# Patient Record
Sex: Male | Born: 1940
Health system: Southern US, Community
[De-identification: ages and names within clinical notes are randomized; demographics above are authoritative.]

## PROBLEM LIST (undated history)

## (undated) DIAGNOSIS — I951 Orthostatic hypotension: Secondary | ICD-10-CM

## (undated) DIAGNOSIS — H3530 Unspecified macular degeneration: Secondary | ICD-10-CM

## (undated) DIAGNOSIS — N400 Enlarged prostate without lower urinary tract symptoms: Secondary | ICD-10-CM

## (undated) DIAGNOSIS — E781 Pure hyperglyceridemia: Secondary | ICD-10-CM

## (undated) DIAGNOSIS — G20 Parkinson's disease: Secondary | ICD-10-CM

## (undated) DIAGNOSIS — F0390 Unspecified dementia without behavioral disturbance: Secondary | ICD-10-CM

## (undated) DIAGNOSIS — M1990 Unspecified osteoarthritis, unspecified site: Secondary | ICD-10-CM

## (undated) DIAGNOSIS — C801 Malignant (primary) neoplasm, unspecified: Secondary | ICD-10-CM

## (undated) DIAGNOSIS — G20A1 Parkinson's disease without dyskinesia, without mention of fluctuations: Secondary | ICD-10-CM

## (undated) DIAGNOSIS — H409 Unspecified glaucoma: Secondary | ICD-10-CM

## (undated) DIAGNOSIS — R42 Dizziness and giddiness: Secondary | ICD-10-CM

## (undated) DIAGNOSIS — F419 Anxiety disorder, unspecified: Secondary | ICD-10-CM

## (undated) DIAGNOSIS — G903 Multi-system degeneration of the autonomic nervous system: Secondary | ICD-10-CM

## (undated) DIAGNOSIS — K219 Gastro-esophageal reflux disease without esophagitis: Secondary | ICD-10-CM

## (undated) DIAGNOSIS — Z87442 Personal history of urinary calculi: Secondary | ICD-10-CM

## (undated) HISTORY — PX: TONSILLECTOMY: SUR1361

## (undated) HISTORY — PX: HAND SURGERY: SHX662

## (undated) HISTORY — PX: EYE SURGERY: SHX253

---
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
## 1997-01-29 HISTORY — PX: SHOULDER ARTHROSCOPY: SHX128

## 2005-01-29 DIAGNOSIS — C801 Malignant (primary) neoplasm, unspecified: Secondary | ICD-10-CM

## 2005-01-29 HISTORY — DX: Malignant (primary) neoplasm, unspecified: C80.1

## 2005-06-18 ENCOUNTER — Ambulatory Visit: Payer: Self-pay | Admitting: Urology

## 2005-07-02 ENCOUNTER — Ambulatory Visit: Payer: Self-pay | Admitting: Radiation Oncology

## 2005-08-06 ENCOUNTER — Ambulatory Visit: Payer: Self-pay | Admitting: Unknown Physician Specialty

## 2008-04-22 ENCOUNTER — Ambulatory Visit: Payer: Self-pay | Admitting: Unknown Physician Specialty

## 2009-01-25 ENCOUNTER — Ambulatory Visit: Payer: Self-pay | Admitting: Ophthalmology

## 2013-07-09 ENCOUNTER — Ambulatory Visit: Payer: Self-pay | Admitting: Neurology

## 2013-12-10 ENCOUNTER — Ambulatory Visit: Payer: Self-pay

## 2013-12-30 ENCOUNTER — Ambulatory Visit: Payer: Self-pay

## 2015-02-02 DIAGNOSIS — H353212 Exudative age-related macular degeneration, right eye, with inactive choroidal neovascularization: Secondary | ICD-10-CM | POA: Diagnosis not present

## 2015-02-18 DIAGNOSIS — G629 Polyneuropathy, unspecified: Secondary | ICD-10-CM | POA: Diagnosis not present

## 2015-02-18 DIAGNOSIS — G20 Parkinson's disease: Secondary | ICD-10-CM | POA: Diagnosis not present

## 2015-02-21 DIAGNOSIS — H353222 Exudative age-related macular degeneration, left eye, with inactive choroidal neovascularization: Secondary | ICD-10-CM | POA: Diagnosis not present

## 2015-04-18 DIAGNOSIS — H401132 Primary open-angle glaucoma, bilateral, moderate stage: Secondary | ICD-10-CM | POA: Diagnosis not present

## 2015-04-25 DIAGNOSIS — H353212 Exudative age-related macular degeneration, right eye, with inactive choroidal neovascularization: Secondary | ICD-10-CM | POA: Diagnosis not present

## 2015-04-27 DIAGNOSIS — H353222 Exudative age-related macular degeneration, left eye, with inactive choroidal neovascularization: Secondary | ICD-10-CM | POA: Diagnosis not present

## 2015-04-29 DIAGNOSIS — H401131 Primary open-angle glaucoma, bilateral, mild stage: Secondary | ICD-10-CM | POA: Diagnosis not present

## 2015-06-10 DIAGNOSIS — Z79899 Other long term (current) drug therapy: Secondary | ICD-10-CM | POA: Diagnosis not present

## 2015-06-10 DIAGNOSIS — E782 Mixed hyperlipidemia: Secondary | ICD-10-CM | POA: Diagnosis not present

## 2015-06-16 DIAGNOSIS — G20 Parkinson's disease: Secondary | ICD-10-CM | POA: Diagnosis not present

## 2015-06-16 DIAGNOSIS — Z0000 Encounter for general adult medical examination without abnormal findings: Secondary | ICD-10-CM | POA: Diagnosis not present

## 2015-07-01 DIAGNOSIS — H353222 Exudative age-related macular degeneration, left eye, with inactive choroidal neovascularization: Secondary | ICD-10-CM | POA: Diagnosis not present

## 2015-07-11 DIAGNOSIS — H353212 Exudative age-related macular degeneration, right eye, with inactive choroidal neovascularization: Secondary | ICD-10-CM | POA: Diagnosis not present

## 2015-07-15 DIAGNOSIS — Z85828 Personal history of other malignant neoplasm of skin: Secondary | ICD-10-CM | POA: Diagnosis not present

## 2015-07-15 DIAGNOSIS — X32XXXA Exposure to sunlight, initial encounter: Secondary | ICD-10-CM | POA: Diagnosis not present

## 2015-07-15 DIAGNOSIS — L218 Other seborrheic dermatitis: Secondary | ICD-10-CM | POA: Diagnosis not present

## 2015-07-15 DIAGNOSIS — D225 Melanocytic nevi of trunk: Secondary | ICD-10-CM | POA: Diagnosis not present

## 2015-07-15 DIAGNOSIS — L570 Actinic keratosis: Secondary | ICD-10-CM | POA: Diagnosis not present

## 2015-08-19 DIAGNOSIS — G629 Polyneuropathy, unspecified: Secondary | ICD-10-CM | POA: Diagnosis not present

## 2015-08-19 DIAGNOSIS — G20 Parkinson's disease: Secondary | ICD-10-CM | POA: Diagnosis not present

## 2015-09-07 DIAGNOSIS — R351 Nocturia: Secondary | ICD-10-CM | POA: Diagnosis not present

## 2015-09-07 DIAGNOSIS — N401 Enlarged prostate with lower urinary tract symptoms: Secondary | ICD-10-CM | POA: Diagnosis not present

## 2015-09-07 DIAGNOSIS — D400 Neoplasm of uncertain behavior of prostate: Secondary | ICD-10-CM | POA: Diagnosis not present

## 2015-09-07 DIAGNOSIS — N5201 Erectile dysfunction due to arterial insufficiency: Secondary | ICD-10-CM | POA: Diagnosis not present

## 2015-09-07 DIAGNOSIS — C61 Malignant neoplasm of prostate: Secondary | ICD-10-CM | POA: Diagnosis not present

## 2015-09-12 DIAGNOSIS — H353222 Exudative age-related macular degeneration, left eye, with inactive choroidal neovascularization: Secondary | ICD-10-CM | POA: Diagnosis not present

## 2015-10-24 DIAGNOSIS — H401130 Primary open-angle glaucoma, bilateral, stage unspecified: Secondary | ICD-10-CM | POA: Diagnosis not present

## 2015-10-28 DIAGNOSIS — H353212 Exudative age-related macular degeneration, right eye, with inactive choroidal neovascularization: Secondary | ICD-10-CM | POA: Diagnosis not present

## 2015-10-28 DIAGNOSIS — H353222 Exudative age-related macular degeneration, left eye, with inactive choroidal neovascularization: Secondary | ICD-10-CM | POA: Diagnosis not present

## 2015-11-25 DIAGNOSIS — H353221 Exudative age-related macular degeneration, left eye, with active choroidal neovascularization: Secondary | ICD-10-CM | POA: Diagnosis not present

## 2016-01-13 DIAGNOSIS — H353212 Exudative age-related macular degeneration, right eye, with inactive choroidal neovascularization: Secondary | ICD-10-CM | POA: Diagnosis not present

## 2016-01-30 HISTORY — PX: OTHER SURGICAL HISTORY: SHX169

## 2016-02-03 DIAGNOSIS — H353222 Exudative age-related macular degeneration, left eye, with inactive choroidal neovascularization: Secondary | ICD-10-CM | POA: Diagnosis not present

## 2016-02-20 DIAGNOSIS — R2689 Other abnormalities of gait and mobility: Secondary | ICD-10-CM | POA: Diagnosis not present

## 2016-02-20 DIAGNOSIS — G20 Parkinson's disease: Secondary | ICD-10-CM | POA: Diagnosis not present

## 2016-02-20 DIAGNOSIS — R4189 Other symptoms and signs involving cognitive functions and awareness: Secondary | ICD-10-CM | POA: Diagnosis not present

## 2016-03-30 DIAGNOSIS — H353222 Exudative age-related macular degeneration, left eye, with inactive choroidal neovascularization: Secondary | ICD-10-CM | POA: Diagnosis not present

## 2016-04-06 DIAGNOSIS — H353211 Exudative age-related macular degeneration, right eye, with active choroidal neovascularization: Secondary | ICD-10-CM | POA: Diagnosis not present

## 2016-05-09 DIAGNOSIS — H401130 Primary open-angle glaucoma, bilateral, stage unspecified: Secondary | ICD-10-CM | POA: Diagnosis not present

## 2016-05-17 DIAGNOSIS — H401130 Primary open-angle glaucoma, bilateral, stage unspecified: Secondary | ICD-10-CM | POA: Diagnosis not present

## 2016-05-21 DIAGNOSIS — G4752 REM sleep behavior disorder: Secondary | ICD-10-CM | POA: Diagnosis not present

## 2016-05-21 DIAGNOSIS — G903 Multi-system degeneration of the autonomic nervous system: Secondary | ICD-10-CM | POA: Diagnosis not present

## 2016-05-21 DIAGNOSIS — F0280 Dementia in other diseases classified elsewhere without behavioral disturbance: Secondary | ICD-10-CM | POA: Diagnosis not present

## 2016-05-21 DIAGNOSIS — G20 Parkinson's disease: Secondary | ICD-10-CM | POA: Diagnosis not present

## 2016-06-08 DIAGNOSIS — H353222 Exudative age-related macular degeneration, left eye, with inactive choroidal neovascularization: Secondary | ICD-10-CM | POA: Diagnosis not present

## 2016-06-11 DIAGNOSIS — Z0000 Encounter for general adult medical examination without abnormal findings: Secondary | ICD-10-CM | POA: Diagnosis not present

## 2016-06-15 DIAGNOSIS — H353212 Exudative age-related macular degeneration, right eye, with inactive choroidal neovascularization: Secondary | ICD-10-CM | POA: Diagnosis not present

## 2016-06-18 DIAGNOSIS — G20 Parkinson's disease: Secondary | ICD-10-CM | POA: Diagnosis not present

## 2016-06-18 DIAGNOSIS — Z0000 Encounter for general adult medical examination without abnormal findings: Secondary | ICD-10-CM | POA: Diagnosis not present

## 2016-06-18 DIAGNOSIS — E782 Mixed hyperlipidemia: Secondary | ICD-10-CM | POA: Diagnosis not present

## 2016-06-18 DIAGNOSIS — Z125 Encounter for screening for malignant neoplasm of prostate: Secondary | ICD-10-CM | POA: Diagnosis not present

## 2016-06-18 DIAGNOSIS — Z79899 Other long term (current) drug therapy: Secondary | ICD-10-CM | POA: Diagnosis not present

## 2016-07-18 DIAGNOSIS — X32XXXA Exposure to sunlight, initial encounter: Secondary | ICD-10-CM | POA: Diagnosis not present

## 2016-07-18 DIAGNOSIS — D485 Neoplasm of uncertain behavior of skin: Secondary | ICD-10-CM | POA: Diagnosis not present

## 2016-07-18 DIAGNOSIS — D2261 Melanocytic nevi of right upper limb, including shoulder: Secondary | ICD-10-CM | POA: Diagnosis not present

## 2016-07-18 DIAGNOSIS — D2272 Melanocytic nevi of left lower limb, including hip: Secondary | ICD-10-CM | POA: Diagnosis not present

## 2016-07-18 DIAGNOSIS — D225 Melanocytic nevi of trunk: Secondary | ICD-10-CM | POA: Diagnosis not present

## 2016-07-18 DIAGNOSIS — C44321 Squamous cell carcinoma of skin of nose: Secondary | ICD-10-CM | POA: Diagnosis not present

## 2016-07-18 DIAGNOSIS — Z85828 Personal history of other malignant neoplasm of skin: Secondary | ICD-10-CM | POA: Diagnosis not present

## 2016-07-18 DIAGNOSIS — L570 Actinic keratosis: Secondary | ICD-10-CM | POA: Diagnosis not present

## 2016-08-08 DIAGNOSIS — C44321 Squamous cell carcinoma of skin of nose: Secondary | ICD-10-CM | POA: Diagnosis not present

## 2016-08-08 DIAGNOSIS — Z85828 Personal history of other malignant neoplasm of skin: Secondary | ICD-10-CM | POA: Diagnosis not present

## 2016-08-22 DIAGNOSIS — G4752 REM sleep behavior disorder: Secondary | ICD-10-CM | POA: Diagnosis not present

## 2016-08-22 DIAGNOSIS — G903 Multi-system degeneration of the autonomic nervous system: Secondary | ICD-10-CM | POA: Diagnosis not present

## 2016-08-22 DIAGNOSIS — M720 Palmar fascial fibromatosis [Dupuytren]: Secondary | ICD-10-CM | POA: Diagnosis not present

## 2016-08-22 DIAGNOSIS — F0280 Dementia in other diseases classified elsewhere without behavioral disturbance: Secondary | ICD-10-CM | POA: Diagnosis not present

## 2016-08-22 DIAGNOSIS — G20 Parkinson's disease: Secondary | ICD-10-CM | POA: Diagnosis not present

## 2016-08-22 DIAGNOSIS — R4189 Other symptoms and signs involving cognitive functions and awareness: Secondary | ICD-10-CM | POA: Diagnosis not present

## 2016-08-24 DIAGNOSIS — H353212 Exudative age-related macular degeneration, right eye, with inactive choroidal neovascularization: Secondary | ICD-10-CM | POA: Diagnosis not present

## 2016-08-24 DIAGNOSIS — H353222 Exudative age-related macular degeneration, left eye, with inactive choroidal neovascularization: Secondary | ICD-10-CM | POA: Diagnosis not present

## 2016-08-30 DIAGNOSIS — M720 Palmar fascial fibromatosis [Dupuytren]: Secondary | ICD-10-CM | POA: Diagnosis not present

## 2016-08-30 DIAGNOSIS — M67449 Ganglion, unspecified hand: Secondary | ICD-10-CM | POA: Diagnosis not present

## 2016-09-10 DIAGNOSIS — N5201 Erectile dysfunction due to arterial insufficiency: Secondary | ICD-10-CM | POA: Diagnosis not present

## 2016-09-10 DIAGNOSIS — R351 Nocturia: Secondary | ICD-10-CM | POA: Diagnosis not present

## 2016-09-10 DIAGNOSIS — C61 Malignant neoplasm of prostate: Secondary | ICD-10-CM | POA: Diagnosis not present

## 2016-09-10 DIAGNOSIS — N486 Induration penis plastica: Secondary | ICD-10-CM | POA: Diagnosis not present

## 2016-09-10 DIAGNOSIS — N401 Enlarged prostate with lower urinary tract symptoms: Secondary | ICD-10-CM | POA: Diagnosis not present

## 2016-09-10 DIAGNOSIS — D400 Neoplasm of uncertain behavior of prostate: Secondary | ICD-10-CM | POA: Diagnosis not present

## 2016-10-29 DIAGNOSIS — H353212 Exudative age-related macular degeneration, right eye, with inactive choroidal neovascularization: Secondary | ICD-10-CM | POA: Diagnosis not present

## 2016-10-29 DIAGNOSIS — H353222 Exudative age-related macular degeneration, left eye, with inactive choroidal neovascularization: Secondary | ICD-10-CM | POA: Diagnosis not present

## 2016-11-16 DIAGNOSIS — H401130 Primary open-angle glaucoma, bilateral, stage unspecified: Secondary | ICD-10-CM | POA: Diagnosis not present

## 2016-11-26 DIAGNOSIS — G903 Multi-system degeneration of the autonomic nervous system: Secondary | ICD-10-CM | POA: Diagnosis not present

## 2016-11-26 DIAGNOSIS — G4752 REM sleep behavior disorder: Secondary | ICD-10-CM | POA: Diagnosis not present

## 2016-11-26 DIAGNOSIS — G20 Parkinson's disease: Secondary | ICD-10-CM | POA: Diagnosis not present

## 2016-11-26 DIAGNOSIS — M720 Palmar fascial fibromatosis [Dupuytren]: Secondary | ICD-10-CM | POA: Diagnosis not present

## 2016-11-26 DIAGNOSIS — F0280 Dementia in other diseases classified elsewhere without behavioral disturbance: Secondary | ICD-10-CM | POA: Diagnosis not present

## 2017-01-14 DIAGNOSIS — H353212 Exudative age-related macular degeneration, right eye, with inactive choroidal neovascularization: Secondary | ICD-10-CM | POA: Diagnosis not present

## 2017-01-14 DIAGNOSIS — H353222 Exudative age-related macular degeneration, left eye, with inactive choroidal neovascularization: Secondary | ICD-10-CM | POA: Diagnosis not present

## 2017-01-28 DIAGNOSIS — Z08 Encounter for follow-up examination after completed treatment for malignant neoplasm: Secondary | ICD-10-CM | POA: Diagnosis not present

## 2017-01-28 DIAGNOSIS — Z85828 Personal history of other malignant neoplasm of skin: Secondary | ICD-10-CM | POA: Diagnosis not present

## 2017-01-28 DIAGNOSIS — L570 Actinic keratosis: Secondary | ICD-10-CM | POA: Diagnosis not present

## 2017-01-28 DIAGNOSIS — X32XXXA Exposure to sunlight, initial encounter: Secondary | ICD-10-CM | POA: Diagnosis not present

## 2017-01-28 DIAGNOSIS — D225 Melanocytic nevi of trunk: Secondary | ICD-10-CM | POA: Diagnosis not present

## 2017-02-13 DIAGNOSIS — M7752 Other enthesopathy of left foot: Secondary | ICD-10-CM | POA: Diagnosis not present

## 2017-02-13 DIAGNOSIS — Q828 Other specified congenital malformations of skin: Secondary | ICD-10-CM | POA: Diagnosis not present

## 2017-02-26 DIAGNOSIS — G903 Multi-system degeneration of the autonomic nervous system: Secondary | ICD-10-CM | POA: Diagnosis not present

## 2017-02-26 DIAGNOSIS — M720 Palmar fascial fibromatosis [Dupuytren]: Secondary | ICD-10-CM | POA: Diagnosis not present

## 2017-02-26 DIAGNOSIS — F0280 Dementia in other diseases classified elsewhere without behavioral disturbance: Secondary | ICD-10-CM | POA: Diagnosis not present

## 2017-02-26 DIAGNOSIS — G4752 REM sleep behavior disorder: Secondary | ICD-10-CM | POA: Diagnosis not present

## 2017-02-26 DIAGNOSIS — G20 Parkinson's disease: Secondary | ICD-10-CM | POA: Diagnosis not present

## 2017-02-26 DIAGNOSIS — R4189 Other symptoms and signs involving cognitive functions and awareness: Secondary | ICD-10-CM | POA: Diagnosis not present

## 2017-03-25 DIAGNOSIS — R109 Unspecified abdominal pain: Secondary | ICD-10-CM | POA: Diagnosis not present

## 2017-03-25 DIAGNOSIS — H353222 Exudative age-related macular degeneration, left eye, with inactive choroidal neovascularization: Secondary | ICD-10-CM | POA: Diagnosis not present

## 2017-03-25 DIAGNOSIS — H353212 Exudative age-related macular degeneration, right eye, with inactive choroidal neovascularization: Secondary | ICD-10-CM | POA: Diagnosis not present

## 2017-03-25 DIAGNOSIS — H532 Diplopia: Secondary | ICD-10-CM | POA: Diagnosis not present

## 2017-04-03 DIAGNOSIS — H0011 Chalazion right upper eyelid: Secondary | ICD-10-CM | POA: Diagnosis not present

## 2017-04-29 DIAGNOSIS — R42 Dizziness and giddiness: Secondary | ICD-10-CM

## 2017-04-29 HISTORY — DX: Dizziness and giddiness: R42

## 2017-05-07 DIAGNOSIS — H532 Diplopia: Secondary | ICD-10-CM | POA: Diagnosis not present

## 2017-05-07 DIAGNOSIS — H409 Unspecified glaucoma: Secondary | ICD-10-CM | POA: Diagnosis not present

## 2017-05-07 DIAGNOSIS — H3530 Unspecified macular degeneration: Secondary | ICD-10-CM | POA: Diagnosis not present

## 2017-05-14 DIAGNOSIS — K4090 Unilateral inguinal hernia, without obstruction or gangrene, not specified as recurrent: Secondary | ICD-10-CM | POA: Diagnosis not present

## 2017-05-16 ENCOUNTER — Ambulatory Visit: Payer: Self-pay | Admitting: General Surgery

## 2017-05-16 DIAGNOSIS — K4090 Unilateral inguinal hernia, without obstruction or gangrene, not specified as recurrent: Secondary | ICD-10-CM | POA: Diagnosis not present

## 2017-05-16 NOTE — H&P (View-Only) (Signed)
PATIENT PROFILE: Steven Buckley is a 77 y.o. male who presents to the Clinic for consultation at the request of Dr. Sabra Buckley for evaluation of right inguinal hernia.  PCP:  Steven Pax, MD  HISTORY OF PRESENT ILLNESS: Mr. Steven Buckley reports feeling a lump on the right groin for several months but has was having pain. In the last 3 weeks has has been having more discomfort, pain and the lump has got bigger. Pain is increased with doing heavy lifting and walking. Pain is relieved with resting and when the hernia is reduced. Pain does not radiates to other part of the body. Patient refers pain has affected the distance he is able to walk due to the pain and the discomfort. Denies episodes of abdominal distention, nausea or vomiting. Patient has history of left inguinal hernia repair when he was a child. No other abdominal surgeries.   PROBLEM LIST: Problem List  Date Reviewed: 02/26/2017         Noted   Non-recurrent unilateral inguinal hernia without obstruction or gangrene 05/16/2017   Dupuytren's contracture 08/30/2016   Mucous cyst of finger 08/30/2016   Medicare annual wellness visit, initial 06/18/2016   Overview    5/18      Dementia 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 (CMS-HCC) 05/21/2016   Neurogenic orthostatic hypotension (CMS-HCC) 05/21/2016   RBD (REM behavioral disorder) 05/21/2016   Parkinson's disease (CMS-HCC) 03/03/2016   Silent thyroiditis 12/28/2013   Mixed hypercholesterolemia and hypertriglyceridemia (Chronic) 04/18/2011   Osteoarthritis (Chronic) 04/18/2011   H/O prostate cancer 04/18/2011   Macular degeneration (Chronic) 04/18/2011      GENERAL REVIEW OF SYSTEMS:   General ROS: negative for - chills, fatigue, fever, weight gain or weight loss Allergy and Immunology ROS: negative for - hives  Hematological and Lymphatic ROS: negative for - bleeding problems or bruising, negative for palpable nodes Endocrine ROS: negative for - heat or cold  intolerance, hair changes Respiratory ROS: negative for - cough, shortness of breath or wheezing Cardiovascular ROS: no chest pain or palpitations GI ROS: negative for nausea, vomiting, abdominal pain, diarrhea, constipation Musculoskeletal ROS: negative for - joint swelling or muscle pain Neurological ROS: negative for - confusion, syncope Dermatological ROS: negative for pruritus and rash Psychiatric: negative for anxiety, depression, difficulty sleeping and memory loss  MEDICATIONS: Current Outpatient Medications  Medication Sig Dispense Refill  . bevacizumab (AVASTIN) 25 mg/mL injection Inject 1 mg into the vein. Injection in 1 eye then injection in the other 8 the next week and patient gets injections every 6-8 weeks    . calcium citrate-vitamin D3 (CITRACAL+D) 315-200 mg-unit tablet Take 1 tablet by mouth once daily.    . carbidopa-levodopa (SINEMET) 25-100 mg tablet TAKE TWO TABLETS BY MOUTH 4 TIMES DAILY 720 tablet 3  . CHOLECALCIFEROL, VITAMIN D3, (VITAMIN D3 ORAL) Take 2,000 Units by mouth once daily.      Marland Kitchen co-enzyme Q-10, ubiquinone, 30 mg capsule Take 30 mg by mouth once daily.     . CYANOCOBALAMIN, VITAMIN B-12, (VITAMIN B-12 ORAL) Place 1 tablet under the tongue once daily.      . flaxseed oil Oil Take by mouth once daily. Ground up seeds, 1/4 cup daily    . fludrocortisone (FLORINEF) 0.1 mg tablet Take 1 tablet (0.1 mg total) by mouth 2 (two) times daily. Take in the morning and afternoon. 180 tablet 3  . ketoconazole (NIZORAL) 2 % shampoo once daily as needed.      . melatonin 10 mg Tab  Take by mouth    . multivitamin tablet Take 1 tablet by mouth once daily.    Marland Kitchen NIACIN, INOSITOL NIACINATE, (NIACIN FLUSH FREE ORAL) Take by mouth once daily.     . nutritional supplements Liqd Take 6 capsules by mouth once daily. Juice Plus    . polyethylene glycol (MIRALAX) packet MIX AND TAKE ONE PACKET (17 GRAMS) BY MOUTH ONCE DAILY. MIX IN 4 TO 8 OUNCES OF FLUID PRIOR TO TAKING 90  packet 0  . RAPAFLO 8 mg capsule Take 1 capsule by mouth once a week.      . tamsulosin (FLOMAX) 0.4 mg capsule Take 0.4 mg by mouth twice a week. Take 30 minutes after same meal each day.    . timolol maleate (TIMOPTIC) 0.5 % ophthalmic solution Place 1 drop into both eyes daily.    Marland Kitchen VIT A/VIT C/VIT E/ZINC/COPPER (PRESERVISION AREDS ORAL) Take 1 tablet by mouth 2 (two) times daily.      . carbidopa-levodopa (SINEMET CR) 50-200 mg CR tablet Take 1 tablet by mouth nightly 90 tablet 3   No current facility-administered medications for this visit.     ALLERGIES: Patient has no known allergies.  PAST MEDICAL HISTORY: Past Medical History:  Diagnosis Date  . BPH (benign prostatic hypertrophy)    takes Saw palmetto  . Dementia 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 (CMS-HCC) 05/21/2016  . GERD (gastroesophageal reflux disease)   . Glaucoma (increased eye pressure)    Chronic Angle-Closure - followed at [facility]  . Hyperlipidemia, unspecified   . Macular degeneration    followed at [facility]  . Osteoarthritis   . Prostate cancer (CMS-HCC)   . Squamous cell carcinoma    nose    PAST SURGICAL HISTORY: Past Surgical History:  Procedure Laterality Date  . COLONOSCOPY  06/06/00; 11/12/12; 04/22/08; 04/21/13   Adenomatous polyp  . Hand surgery  1994  . HERNIA REPAIR    . INSERTION PROSTATE RADIATION SEED    . TONSILLECTOMY       FAMILY HISTORY: Family History  Problem Relation Age of Onset  . Cirrhosis Mother   . Liver disease Mother   . COPD Father   . Myocardial Infarction (Heart attack) Father   . Brain cancer Sister   . Lung cancer Sister   . Diabetes Brother      SOCIAL HISTORY: Social History   Socioeconomic History  . Marital status: Married    Spouse name: Not on file  . Number of children: Not on file  . Years of education: Not on file  . Highest education level: Not on file  Occupational History  . Not on file  Social Needs  . Financial resource  strain: Not on file  . Food insecurity:    Worry: Not on file    Inability: Not on file  . Transportation needs:    Medical: Not on file    Non-medical: Not on file  Tobacco Use  . Smoking status: Former Smoker    Last attempt to quit: 04/18/1967    Years since quitting: 50.1  . Smokeless tobacco: Former Network engineer and Sexual Activity  . Alcohol use: Yes    Types: 1 Glasses of wine per week    Comment: Drink wine about 1 time a month  . Drug use: No  . Sexual activity: Not Currently    Partners: Female    Comment: NA  Other Topics Concern  . Not on file  Social History Narrative  [place]  Pets: Dog. Cats   No active animals on farm.    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     PHYSICAL EXAM: Vitals:   05/16/17 1001  BP: 123/84  Pulse: 71  Temp: (!) 35.9 C (96.6 F)   Body mass index is 28.03 kg/m. Weight: 91.2 kg (201 lb)   GENERAL: Alert, active, oriented x3  HEENT: Pupils equal reactive to light. Extraocular movements are intact. Sclera clear. Palpebral conjunctiva normal red color.Pharynx clear.  NECK: Supple with no palpable mass and no adenopathy.  LUNGS: Sound clear with no rales rhonchi or wheezes.  HEART: Regular rhythm S1 and S2 without murmur.  ABDOMEN: Soft and depressible, nontender with no palpable mass, no hepatomegaly. Right inguinal hernia with mild tender upon reduction. Reducible with minor difficulty.   EXTREMITIES: Well-developed well-nourished symmetrical with no dependent edema.  NEUROLOGICAL: Awake alert oriented, facial expression symmetrical, moving all extremities.  REVIEW OF DATA: I have reviewed the following data today: Office Visit on 03/25/2017  Component Date Value  . WBC (White Blood Cell Co* 03/25/2017 6.1   . RBC (Red Blood Cell Coun* 03/25/2017 4.95   . Hemoglobin 03/25/2017 15.4   . Hematocrit 03/25/2017 47.1   . MCV (Mean  Corpuscular Vo* 03/25/2017 95.2   . MCH (Mean Corpuscular He* 03/25/2017 31.1   . MCHC (Mean Corpuscular H* 03/25/2017 32.7   . Platelet Count 03/25/2017 170   . RDW-CV (Red Cell Distrib* 03/25/2017 13.3   . MPV (Mean Platelet Volum* 03/25/2017 9.9   . Neutrophils 03/25/2017 3.74   . Lymphocytes 03/25/2017 1.76   . Monocytes 03/25/2017 0.51   . Eosinophils 03/25/2017 0.06   . Basophils 03/25/2017 0.03   . Neutrophil % 03/25/2017 61.1   . Lymphocyte % 03/25/2017 28.8   . Monocyte % 03/25/2017 8.3   . Eosinophil % 03/25/2017 1.0   . Basophil% 03/25/2017 0.5   . Immature Granulocyte % 03/25/2017 0.3   . Immature Granulocyte Cou* 03/25/2017 0.02   . Glucose 03/25/2017 102   . Sodium 03/25/2017 141   . Potassium 03/25/2017 3.7   . Chloride 03/25/2017 105   . Carbon Dioxide (CO2) 03/25/2017 33.2*  . Urea Nitrogen (BUN) 03/25/2017 11   . Creatinine 03/25/2017 0.9   . Glomerular Filtration Ra* 03/25/2017 82   . Calcium 03/25/2017 9.0   . AST  03/25/2017 19   . ALT  03/25/2017 6   . Alk Phos (alkaline Phosp* 03/25/2017 84   . Albumin 03/25/2017 3.8   . Bilirubin, Total 03/25/2017 0.8   . Protein, Total 03/25/2017 6.5   . A/G Ratio 03/25/2017 1.4   . Lipase 03/25/2017 5*  . Color 03/25/2017 Yellow   . Clarity 03/25/2017 Clear   . Specific Gravity 03/25/2017 1.010   . pH, Urine 03/25/2017 6.5   . Protein, Urinalysis 03/25/2017 Negative   . Glucose, Urinalysis 03/25/2017 Negative   . Ketones, Urinalysis 03/25/2017 Negative   . Blood, Urinalysis 03/25/2017 Negative   . Nitrite, Urinalysis 03/25/2017 Negative   . Leukocyte Esterase, Urin* 03/25/2017 Negative   . White Blood Cells, Urina* 03/25/2017 None Seen   . Red Blood Cells, Urinaly* 03/25/2017 None Seen   . Bacteria, Urinalysis 03/25/2017 Rare*  . Squamous Epithelial Cell* 03/25/2017 None Seen      ASSESSMENT: Mr. Saindon is a 77 y.o. male presenting for consultation forright inguinal hernia.    The patient presents  with a  symptomatic, reducible inguinal hernia. Patient was oriented about the diagnosis of inguinal hernia and its implication. The patient was oriented about the treatment alternatives (observation vs surgical repair). Due to patient symptoms, repair is recommended. Patient oriented about the surgical procedure, the use of mesh and its risk of complications such as: infection, bleeding, injury to vas deference, vasculature and testicle, injury to bowel or bladder, and chronic pain.   PLAN: 1. Right inguinal hernia repair with mesh 16109 2. CBC, CMP done (03/25/17) 3. Internal Medicine Clearance by Dr. Sabra Buckley - done 4. Avoid aspirin 5 days before suregery  Patient and his wife verbalized understanding, all questions were answered, and were agreeable with the plan outlined above.   Herbert Pun, MD  Electronically signed by Herbert Pun, MD

## 2017-05-16 NOTE — H&P (Signed)
PATIENT PROFILE: Steven Buckley is a 77 y.o. male who presents to the Clinic for consultation at the request of Dr. Miller for evaluation of right inguinal hernia.  PCP:  Miller, Mark Frederic, MD  HISTORY OF PRESENT ILLNESS: [name] reports feeling a lump on the right groin for several months but has was having pain. In the last 3 weeks has has been having more discomfort, pain and the lump has got bigger. Pain is increased with doing heavy lifting and walking. Pain is relieved with resting and when the hernia is reduced. Pain does not radiates to other part of the body. Patient refers pain has affected the distance he is able to walk due to the pain and the discomfort. Denies episodes of abdominal distention, nausea or vomiting. Patient has history of left inguinal hernia repair when he was a child. No other abdominal surgeries.   PROBLEM LIST: Problem List  Date Reviewed: 02/26/2017         Noted   Non-recurrent unilateral inguinal hernia without obstruction or gangrene 05/16/2017   Dupuytren's contracture 08/30/2016   Mucous cyst of finger 08/30/2016   Medicare annual wellness visit, initial 06/18/2016   Overview    5/18      Dementia 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 (CMS-HCC) 05/21/2016   Neurogenic orthostatic hypotension (CMS-HCC) 05/21/2016   RBD (REM behavioral disorder) 05/21/2016   Parkinson's disease (CMS-HCC) 03/03/2016   Silent thyroiditis 12/28/2013   Mixed hypercholesterolemia and hypertriglyceridemia (Chronic) 04/18/2011   Osteoarthritis (Chronic) 04/18/2011   H/O prostate cancer 04/18/2011   Macular degeneration (Chronic) 04/18/2011      GENERAL REVIEW OF SYSTEMS:   General ROS: negative for - chills, fatigue, fever, weight gain or weight loss Allergy and Immunology ROS: negative for - hives  Hematological and Lymphatic ROS: negative for - bleeding problems or bruising, negative for palpable nodes Endocrine ROS: negative for - heat or cold  intolerance, hair changes Respiratory ROS: negative for - cough, shortness of breath or wheezing Cardiovascular ROS: no chest pain or palpitations GI ROS: negative for nausea, vomiting, abdominal pain, diarrhea, constipation Musculoskeletal ROS: negative for - joint swelling or muscle pain Neurological ROS: negative for - confusion, syncope Dermatological ROS: negative for pruritus and rash Psychiatric: negative for anxiety, depression, difficulty sleeping and memory loss  MEDICATIONS: Current Outpatient Medications  Medication Sig Dispense Refill  . bevacizumab (AVASTIN) 25 mg/mL injection Inject 1 mg into the vein. Injection in 1 eye then injection in the other 8 the next week and patient gets injections every 6-8 weeks    . calcium citrate-vitamin D3 (CITRACAL+D) 315-200 mg-unit tablet Take 1 tablet by mouth once daily.    . carbidopa-levodopa (SINEMET) 25-100 mg tablet TAKE TWO TABLETS BY MOUTH 4 TIMES DAILY 720 tablet 3  . CHOLECALCIFEROL, VITAMIN D3, (VITAMIN D3 ORAL) Take 2,000 Units by mouth once daily.      . co-enzyme Q-10, ubiquinone, 30 mg capsule Take 30 mg by mouth once daily.     . CYANOCOBALAMIN, VITAMIN B-12, (VITAMIN B-12 ORAL) Place 1 tablet under the tongue once daily.      . flaxseed oil Oil Take by mouth once daily. Ground up seeds, 1/4 cup daily    . fludrocortisone (FLORINEF) 0.1 mg tablet Take 1 tablet (0.1 mg total) by mouth 2 (two) times daily. Take in the morning and afternoon. 180 tablet 3  . ketoconazole (NIZORAL) 2 % shampoo once daily as needed.      . melatonin 10 mg Tab   Take by mouth    . multivitamin tablet Take 1 tablet by mouth once daily.    . NIACIN, INOSITOL NIACINATE, (NIACIN FLUSH FREE ORAL) Take by mouth once daily.     . nutritional supplements Liqd Take 6 capsules by mouth once daily. Juice Plus    . polyethylene glycol (MIRALAX) packet MIX AND TAKE ONE PACKET (17 GRAMS) BY MOUTH ONCE DAILY. MIX IN 4 TO 8 OUNCES OF FLUID PRIOR TO TAKING 90  packet 0  . RAPAFLO 8 mg capsule Take 1 capsule by mouth once a week.      . tamsulosin (FLOMAX) 0.4 mg capsule Take 0.4 mg by mouth twice a week. Take 30 minutes after same meal each day.    . timolol maleate (TIMOPTIC) 0.5 % ophthalmic solution Place 1 drop into both eyes daily.    . VIT A/VIT C/VIT E/ZINC/COPPER (PRESERVISION AREDS ORAL) Take 1 tablet by mouth 2 (two) times daily.      . carbidopa-levodopa (SINEMET CR) 50-200 mg CR tablet Take 1 tablet by mouth nightly 90 tablet 3   No current facility-administered medications for this visit.     ALLERGIES: Patient has no known allergies.  PAST MEDICAL HISTORY: Past Medical History:  Diagnosis Date  . BPH (benign prostatic hypertrophy)    takes Saw palmetto  . Dementia 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 (CMS-HCC) 05/21/2016  . GERD (gastroesophageal reflux disease)   . Glaucoma (increased eye pressure)    Chronic Angle-Closure - followed at [facility]  . Hyperlipidemia, unspecified   . Macular degeneration    followed at [facility]  . Osteoarthritis   . Prostate cancer (CMS-HCC)   . Squamous cell carcinoma    nose    PAST SURGICAL HISTORY: Past Surgical History:  Procedure Laterality Date  . COLONOSCOPY  06/06/00; 11/12/12; 04/22/08; 04/21/13   Adenomatous polyp  . Hand surgery  1994  . HERNIA REPAIR    . INSERTION PROSTATE RADIATION SEED    . TONSILLECTOMY       FAMILY HISTORY: Family History  Problem Relation Age of Onset  . Cirrhosis Mother   . Liver disease Mother   . COPD Father   . Myocardial Infarction (Heart attack) Father   . Brain cancer Sister   . Lung cancer Sister   . Diabetes Brother      SOCIAL HISTORY: Social History   Socioeconomic History  . Marital status: Married    Spouse name: Not on file  . Number of children: Not on file  . Years of education: Not on file  . Highest education level: Not on file  Occupational History  . Not on file  Social Needs  . Financial resource  strain: Not on file  . Food insecurity:    Worry: Not on file    Inability: Not on file  . Transportation needs:    Medical: Not on file    Non-medical: Not on file  Tobacco Use  . Smoking status: Former Smoker    Last attempt to quit: 04/18/1967    Years since quitting: 50.1  . Smokeless tobacco: Former User  Substance and Sexual Activity  . Alcohol use: Yes    Types: 1 Glasses of wine per week    Comment: Drink wine about 1 time a month  . Drug use: No  . Sexual activity: Not Currently    Partners: Female    Comment: NA  Other Topics Concern  . Not on file  Social History Narrative     [place]  Pets: Dog. Cats   No active animals on farm.    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     PHYSICAL EXAM: Vitals:   05/16/17 1001  BP: 123/84  Pulse: 71  Temp: (!) 35.9 C (96.6 F)   Body mass index is 28.03 kg/m. Weight: 91.2 kg (201 lb)   GENERAL: Alert, active, oriented x3  HEENT: Pupils equal reactive to light. Extraocular movements are intact. Sclera clear. Palpebral conjunctiva normal red color.Pharynx clear.  NECK: Supple with no palpable mass and no adenopathy.  LUNGS: Sound clear with no rales rhonchi or wheezes.  HEART: Regular rhythm S1 and S2 without murmur.  ABDOMEN: Soft and depressible, nontender with no palpable mass, no hepatomegaly. Right inguinal hernia with mild tender upon reduction. Reducible with minor difficulty.   EXTREMITIES: Well-developed well-nourished symmetrical with no dependent edema.  NEUROLOGICAL: Awake alert oriented, facial expression symmetrical, moving all extremities.  REVIEW OF DATA: I have reviewed the following data today: Office Visit on 03/25/2017  Component Date Value  . WBC (White Blood Cell Co* 03/25/2017 6.1   . RBC (Red Blood Cell Coun* 03/25/2017 4.95   . Hemoglobin 03/25/2017 15.4   . Hematocrit 03/25/2017 47.1   . MCV (Mean  Corpuscular Vo* 03/25/2017 95.2   . MCH (Mean Corpuscular He* 03/25/2017 31.1   . MCHC (Mean Corpuscular H* 03/25/2017 32.7   . Platelet Count 03/25/2017 170   . RDW-CV (Red Cell Distrib* 03/25/2017 13.3   . MPV (Mean Platelet Volum* 03/25/2017 9.9   . Neutrophils 03/25/2017 3.74   . Lymphocytes 03/25/2017 1.76   . Monocytes 03/25/2017 0.51   . Eosinophils 03/25/2017 0.06   . Basophils 03/25/2017 0.03   . Neutrophil % 03/25/2017 61.1   . Lymphocyte % 03/25/2017 28.8   . Monocyte % 03/25/2017 8.3   . Eosinophil % 03/25/2017 1.0   . Basophil% 03/25/2017 0.5   . Immature Granulocyte % 03/25/2017 0.3   . Immature Granulocyte Cou* 03/25/2017 0.02   . Glucose 03/25/2017 102   . Sodium 03/25/2017 141   . Potassium 03/25/2017 3.7   . Chloride 03/25/2017 105   . Carbon Dioxide (CO2) 03/25/2017 33.2*  . Urea Nitrogen (BUN) 03/25/2017 11   . Creatinine 03/25/2017 0.9   . Glomerular Filtration Ra* 03/25/2017 82   . Calcium 03/25/2017 9.0   . AST  03/25/2017 19   . ALT  03/25/2017 6   . Alk Phos (alkaline Phosp* 03/25/2017 84   . Albumin 03/25/2017 3.8   . Bilirubin, Total 03/25/2017 0.8   . Protein, Total 03/25/2017 6.5   . A/G Ratio 03/25/2017 1.4   . Lipase 03/25/2017 5*  . Color 03/25/2017 Yellow   . Clarity 03/25/2017 Clear   . Specific Gravity 03/25/2017 1.010   . pH, Urine 03/25/2017 6.5   . Protein, Urinalysis 03/25/2017 Negative   . Glucose, Urinalysis 03/25/2017 Negative   . Ketones, Urinalysis 03/25/2017 Negative   . Blood, Urinalysis 03/25/2017 Negative   . Nitrite, Urinalysis 03/25/2017 Negative   . Leukocyte Esterase, Urin* 03/25/2017 Negative   . White Blood Cells, Urina* 03/25/2017 None Seen   . Red Blood Cells, Urinaly* 03/25/2017 None Seen   . Bacteria, Urinalysis 03/25/2017 Rare*  . Squamous Epithelial Cell* 03/25/2017 None Seen      ASSESSMENT: Mr. Mccaskey is a 77 y.o. male presenting for consultation forright inguinal hernia.    The patient presents  with a  symptomatic, reducible inguinal hernia. Patient was oriented about the diagnosis of inguinal hernia and its implication. The patient was oriented about the treatment alternatives (observation vs surgical repair). Due to patient symptoms, repair is recommended. Patient oriented about the surgical procedure, the use of mesh and its risk of complications such as: infection, bleeding, injury to vas deference, vasculature and testicle, injury to bowel or bladder, and chronic pain.   PLAN: 1. Right inguinal hernia repair with mesh 16109 2. CBC, CMP done (03/25/17) 3. Internal Medicine Clearance by Dr. Sabra Heck - done 4. Avoid aspirin 5 days before suregery  Patient and his wife verbalized understanding, all questions were answered, and were agreeable with the plan outlined above.   Herbert Pun, MD  Electronically signed by Herbert Pun, MD

## 2017-05-20 ENCOUNTER — Other Ambulatory Visit: Payer: Self-pay

## 2017-05-20 ENCOUNTER — Encounter
Admission: RE | Admit: 2017-05-20 | Discharge: 2017-05-20 | Disposition: A | Discharge status: Home / Self Care | Payer: PPO | Source: Ambulatory Visit | Attending: General Surgery | Admitting: General Surgery

## 2017-05-20 DIAGNOSIS — Z7952 Long term (current) use of systemic steroids: Secondary | ICD-10-CM | POA: Diagnosis not present

## 2017-05-20 DIAGNOSIS — K4090 Unilateral inguinal hernia, without obstruction or gangrene, not specified as recurrent: Secondary | ICD-10-CM | POA: Diagnosis not present

## 2017-05-20 DIAGNOSIS — N400 Enlarged prostate without lower urinary tract symptoms: Secondary | ICD-10-CM | POA: Diagnosis not present

## 2017-05-20 DIAGNOSIS — F0280 Dementia in other diseases classified elsewhere without behavioral disturbance: Secondary | ICD-10-CM | POA: Diagnosis not present

## 2017-05-20 DIAGNOSIS — Z01810 Encounter for preprocedural cardiovascular examination: Secondary | ICD-10-CM | POA: Diagnosis not present

## 2017-05-20 DIAGNOSIS — Z87891 Personal history of nicotine dependence: Secondary | ICD-10-CM | POA: Diagnosis not present

## 2017-05-20 DIAGNOSIS — H409 Unspecified glaucoma: Secondary | ICD-10-CM | POA: Diagnosis not present

## 2017-05-20 DIAGNOSIS — G20 Parkinson's disease: Secondary | ICD-10-CM | POA: Diagnosis not present

## 2017-05-20 DIAGNOSIS — Z79899 Other long term (current) drug therapy: Secondary | ICD-10-CM | POA: Diagnosis not present

## 2017-05-20 DIAGNOSIS — Z8546 Personal history of malignant neoplasm of prostate: Secondary | ICD-10-CM | POA: Diagnosis not present

## 2017-05-20 HISTORY — DX: Gastro-esophageal reflux disease without esophagitis: K21.9

## 2017-05-20 HISTORY — DX: Parkinson's disease: G20

## 2017-05-20 HISTORY — DX: Personal history of urinary calculi: Z87.442

## 2017-05-20 HISTORY — DX: Unspecified macular degeneration: H35.30

## 2017-05-20 HISTORY — DX: Malignant (primary) neoplasm, unspecified: C80.1

## 2017-05-20 HISTORY — DX: Orthostatic hypotension: I95.1

## 2017-05-20 HISTORY — DX: Dizziness and giddiness: R42

## 2017-05-20 HISTORY — DX: Parkinson's disease without dyskinesia, without mention of fluctuations: G20.A1

## 2017-05-20 HISTORY — DX: Anxiety disorder, unspecified: F41.9

## 2017-05-20 HISTORY — DX: Unspecified glaucoma: H40.9

## 2017-05-20 NOTE — Patient Instructions (Signed)
Your procedure is scheduled on:  Wednesday, May 22, 2017  Report to [place]  To find out your arrival time please call [phone] between               1PM - 3PM on Tuesday, April 23rd  Remember: Instructions that are not followed completely may result in serious  medical risk, up to and including death, or upon the discretion of your surgeon  and anesthesiologist your surgery may need to be rescheduled.     _X__ 1. Do not eat food after midnight the night before your procedure.                 No gum chewing or hard candies.                   You may drink clear liquids up to 2 hours                 before you are scheduled to arrive for your surgery-                   DO not drink clear liquids within 2 hours of the start of your surgery.                  Clear Liquids include:  water, apple juice without pulp, clear carbohydrate                 drink such as Clearfast of Gartorade, Black Coffee or Tea (Do not add                 anything to coffee or tea).  __X__2.  On the morning of surgery brush your teeth with toothpaste and water,                    you may rinse your mouth with mouthwash if you wish.                        Do not swallow any toothpaste of mouthwash.     _X__ 3.  No Alcohol for 24 hours before or after surgery.   _X__ 4.  Do Not Smoke or use e-cigarettes For 24 Hours Prior to Your Surgery.                 Do not use any chewable tobacco products for at least 6 hours prior to                 surgery.  ____  5.  Bring all medications with you on the day of surgery if instructed.   ____  6.  Notify your doctor if there is any change in your medical condition      (cold, fever, infections).     Do not wear jewelry, make-up, hairpins, clips or nail polish. Do not wear lotions, powders, or perfumes. You may wear deodorant. Do not shave 48 hours prior to surgery. Men may shave face and neck. Do not  bring valuables to the hospital.    [facility] is not responsible for any belongings or valuables.  Contacts, dentures or bridgework may not be worn into surgery. Leave your suitcase in the car. After surgery it may be brought to your room. For patients admitted to the hospital, discharge time is determined by your treatment team.   Patients discharged the day of surgery will not be  allowed to drive home.   Please read over the following fact sheets that you were given:   PREPARING FOR SURGERY   ____ Take these medicines the morning of surgery with A SIP OF WATER:    1. SINEMET  2. FLOMAX/RAPAFLOW IF IT IS THE DAY!!  3. FLORINEF  4.  5.  6.  ____ Fleet Enema (as directed)   __X__ Use CHG Soap as directed  ____ Use inhalers on the day of surgery  _X_ Stop ASPIRIN NOW  __X__ Stop Anti-inflammatories NOW   _X_ Stop supplements until after surgery.           THIS INCLUDES CITRACAL, VITAMIN D3, CQ10, FLAX SEED, MULTIVITS, NIACIN   ____ Bring C-Pap to the hospital.   [place]vin.  HAVE STOOL SOFTENERS READY FOR HOME USE.  BRING POA PAPERWORK WITH YOU IF YOU REMEMBER.

## 2017-05-21 MED ORDER — CEFAZOLIN SODIUM-DEXTROSE 2-4 GM/100ML-% IV SOLN
2.0000 g | INTRAVENOUS | Status: AC
  Administered 2017-05-22: 2 g via INTRAVENOUS
  Filled 2017-05-21: qty 100

## 2017-05-22 ENCOUNTER — Ambulatory Visit: Payer: PPO | Admitting: Certified Registered"

## 2017-05-22 ENCOUNTER — Ambulatory Visit
Admission: RE | Admit: 2017-05-22 | Discharge: 2017-05-22 | Disposition: A | Discharge status: Home / Self Care | Payer: PPO | Source: Ambulatory Visit | Attending: General Surgery | Admitting: General Surgery

## 2017-05-22 ENCOUNTER — Encounter
Admission: RE | Disposition: A | Discharge status: Home / Self Care | Payer: Self-pay | Source: Ambulatory Visit | Attending: General Surgery

## 2017-05-22 ENCOUNTER — Other Ambulatory Visit: Payer: Self-pay

## 2017-05-22 DIAGNOSIS — Z79899 Other long term (current) drug therapy: Secondary | ICD-10-CM | POA: Insufficient documentation

## 2017-05-22 DIAGNOSIS — N400 Enlarged prostate without lower urinary tract symptoms: Secondary | ICD-10-CM | POA: Insufficient documentation

## 2017-05-22 DIAGNOSIS — Z8546 Personal history of malignant neoplasm of prostate: Secondary | ICD-10-CM | POA: Insufficient documentation

## 2017-05-22 DIAGNOSIS — D176 Benign lipomatous neoplasm of spermatic cord: Secondary | ICD-10-CM | POA: Diagnosis not present

## 2017-05-22 DIAGNOSIS — H409 Unspecified glaucoma: Secondary | ICD-10-CM | POA: Insufficient documentation

## 2017-05-22 DIAGNOSIS — K4090 Unilateral inguinal hernia, without obstruction or gangrene, not specified as recurrent: Secondary | ICD-10-CM | POA: Diagnosis not present

## 2017-05-22 DIAGNOSIS — Z7952 Long term (current) use of systemic steroids: Secondary | ICD-10-CM | POA: Insufficient documentation

## 2017-05-22 DIAGNOSIS — F0280 Dementia in other diseases classified elsewhere without behavioral disturbance: Secondary | ICD-10-CM | POA: Insufficient documentation

## 2017-05-22 DIAGNOSIS — Z87891 Personal history of nicotine dependence: Secondary | ICD-10-CM | POA: Insufficient documentation

## 2017-05-22 DIAGNOSIS — G20 Parkinson's disease: Secondary | ICD-10-CM | POA: Insufficient documentation

## 2017-05-22 HISTORY — PX: INGUINAL HERNIA REPAIR: SHX194

## 2017-05-22 SURGERY — REPAIR, HERNIA, INGUINAL, ADULT
Anesthesia: General | Site: Groin | Laterality: Right | Wound class: "Clean "

## 2017-05-22 MED ORDER — PROPOFOL 10 MG/ML IV BOLUS
INTRAVENOUS | Status: DC | PRN
  Administered 2017-05-22: 30 mg via INTRAVENOUS
  Administered 2017-05-22: 120 mg via INTRAVENOUS

## 2017-05-22 MED ORDER — FAMOTIDINE 20 MG PO TABS: 20.0000 mg | ORAL_TABLET | Freq: Once | ORAL | Status: DC

## 2017-05-22 MED ORDER — ROCURONIUM BROMIDE 100 MG/10ML IV SOLN
INTRAVENOUS | Status: DC | PRN
  Administered 2017-05-22: 50 mg via INTRAVENOUS

## 2017-05-22 MED ORDER — LIDOCAINE HCL (CARDIAC) PF 100 MG/5ML IV SOSY
PREFILLED_SYRINGE | INTRAVENOUS | Status: DC | PRN
  Administered 2017-05-22: 50 mg via INTRAVENOUS

## 2017-05-22 MED ORDER — PROPOFOL 10 MG/ML IV BOLUS
INTRAVENOUS | Status: AC
  Filled 2017-05-22: qty 20

## 2017-05-22 MED ORDER — GLYCOPYRROLATE 0.2 MG/ML IJ SOLN
INTRAMUSCULAR | Status: DC | PRN
  Administered 2017-05-22: 0.1 mg via INTRAVENOUS

## 2017-05-22 MED ORDER — BUPIVACAINE-EPINEPHRINE 0.25% -1:200000 IJ SOLN
INTRAMUSCULAR | Status: DC | PRN
  Administered 2017-05-22: 30 mL

## 2017-05-22 MED ORDER — SUGAMMADEX SODIUM 200 MG/2ML IV SOLN
INTRAVENOUS | Status: DC | PRN
  Administered 2017-05-22: 200 mg via INTRAVENOUS

## 2017-05-22 MED ORDER — FENTANYL CITRATE (PF) 250 MCG/5ML IJ SOLN
INTRAMUSCULAR | Status: AC
  Filled 2017-05-22: qty 5

## 2017-05-22 MED ORDER — ONDANSETRON HCL 4 MG/2ML IJ SOLN
INTRAMUSCULAR | Status: AC
  Filled 2017-05-22: qty 2

## 2017-05-22 MED ORDER — DIPHENHYDRAMINE HCL 50 MG/ML IJ SOLN
INTRAMUSCULAR | Status: DC | PRN
  Administered 2017-05-22: 12.5 mg via INTRAVENOUS

## 2017-05-22 MED ORDER — ONDANSETRON HCL 4 MG/2ML IJ SOLN
INTRAMUSCULAR | Status: DC | PRN
  Administered 2017-05-22: 4 mg via INTRAVENOUS

## 2017-05-22 MED ORDER — DEXAMETHASONE SODIUM PHOSPHATE 10 MG/ML IJ SOLN
INTRAMUSCULAR | Status: DC | PRN
  Administered 2017-05-22: 10 mg via INTRAVENOUS

## 2017-05-22 MED ORDER — FENTANYL CITRATE (PF) 100 MCG/2ML IJ SOLN
INTRAMUSCULAR | Status: DC | PRN
  Administered 2017-05-22 (×3): 50 ug via INTRAVENOUS
  Administered 2017-05-22: 100 ug via INTRAVENOUS

## 2017-05-22 MED ORDER — FENTANYL CITRATE (PF) 100 MCG/2ML IJ SOLN
INTRAMUSCULAR | Status: AC
  Administered 2017-05-22: 25 ug via INTRAVENOUS
  Filled 2017-05-22: qty 2

## 2017-05-22 MED ORDER — FENTANYL CITRATE (PF) 100 MCG/2ML IJ SOLN
25.0000 ug | INTRAMUSCULAR | Status: DC | PRN
  Administered 2017-05-22 (×2): 25 ug via INTRAVENOUS

## 2017-05-22 MED ORDER — ACETAMINOPHEN 10 MG/ML IV SOLN
INTRAVENOUS | Status: AC
Start: 2017-05-22 — End: ?
  Filled 2017-05-22: qty 100

## 2017-05-22 MED ORDER — ROCURONIUM BROMIDE 50 MG/5ML IV SOLN
INTRAVENOUS | Status: AC
Start: 2017-05-22 — End: ?
  Filled 2017-05-22: qty 1

## 2017-05-22 MED ORDER — GLYCOPYRROLATE 0.2 MG/ML IJ SOLN
INTRAMUSCULAR | Status: AC
  Filled 2017-05-22: qty 1

## 2017-05-22 MED ORDER — DEXAMETHASONE SODIUM PHOSPHATE 10 MG/ML IJ SOLN
INTRAMUSCULAR | Status: AC
Start: 2017-05-22 — End: ?
  Filled 2017-05-22: qty 1

## 2017-05-22 MED ORDER — TRAMADOL HCL 50 MG PO TABS
50.0000 mg | ORAL_TABLET | Freq: Four times a day (QID) | ORAL | Status: AC | PRN
  Administered 2017-05-22: 50 mg via ORAL

## 2017-05-22 MED ORDER — FAMOTIDINE 20 MG PO TABS
ORAL_TABLET | ORAL | Status: AC
  Administered 2017-05-22: 20 mg
  Filled 2017-05-22: qty 1

## 2017-05-22 MED ORDER — EPHEDRINE SULFATE 50 MG/ML IJ SOLN
INTRAMUSCULAR | Status: AC
  Filled 2017-05-22: qty 1

## 2017-05-22 MED ORDER — EPHEDRINE SULFATE 50 MG/ML IJ SOLN
INTRAMUSCULAR | Status: DC | PRN
  Administered 2017-05-22 (×2): 10 mg via INTRAVENOUS

## 2017-05-22 MED ORDER — ONDANSETRON HCL 4 MG/2ML IJ SOLN: 4.0000 mg | Freq: Once | INTRAMUSCULAR | Status: DC | PRN

## 2017-05-22 MED ORDER — TRAMADOL HCL 50 MG PO TABS
ORAL_TABLET | ORAL | Status: AC
  Filled 2017-05-22: qty 1

## 2017-05-22 MED ORDER — TRAMADOL HCL 50 MG PO TABS: 50.0000 mg | ORAL_TABLET | Freq: Four times a day (QID) | ORAL | 0 refills | Status: AC | PRN

## 2017-05-22 MED ORDER — CEFAZOLIN SODIUM-DEXTROSE 2-3 GM-%(50ML) IV SOLR
INTRAVENOUS | Status: AC
  Filled 2017-05-22: qty 50

## 2017-05-22 MED ORDER — SUGAMMADEX SODIUM 200 MG/2ML IV SOLN
INTRAVENOUS | Status: AC
  Filled 2017-05-22: qty 2

## 2017-05-22 MED ORDER — LIDOCAINE HCL (PF) 2 % IJ SOLN
INTRAMUSCULAR | Status: AC
  Filled 2017-05-22: qty 10

## 2017-05-22 MED ORDER — BUPIVACAINE-EPINEPHRINE (PF) 0.25% -1:200000 IJ SOLN
INTRAMUSCULAR | Status: AC
  Filled 2017-05-22: qty 30

## 2017-05-22 MED ORDER — LACTATED RINGERS IV SOLN
INTRAVENOUS | Status: DC
  Administered 2017-05-22: 09:00:00 via INTRAVENOUS
  Administered 2017-05-22: 1000 mL via INTRAVENOUS

## 2017-05-22 MED ORDER — ACETAMINOPHEN 10 MG/ML IV SOLN
INTRAVENOUS | Status: DC | PRN
Start: 2017-05-22 — End: 2017-05-22
  Administered 2017-05-22: 1000 mg via INTRAVENOUS

## 2017-05-22 SURGICAL SUPPLY — 35 items
BLADE SURG 15 STRL LF DISP TIS (BLADE) ×1 IMPLANT
BLADE SURG 15 STRL SS (BLADE) ×2
CANISTER SUCT 1200ML W/VALVE (MISCELLANEOUS) ×3 IMPLANT
CHLORAPREP W/TINT 26ML (MISCELLANEOUS) ×3 IMPLANT
DERMABOND ADVANCED (GAUZE/BANDAGES/DRESSINGS) ×2
DERMABOND ADVANCED .7 DNX12 (GAUZE/BANDAGES/DRESSINGS) ×1 IMPLANT
DRAIN PENROSE 1/4X12 LTX (DRAIN) ×3 IMPLANT
DRAPE LAPAROTOMY 100X77 ABD (DRAPES) ×3 IMPLANT
ELECT REM PT RETURN 9FT ADLT (ELECTROSURGICAL) ×3
ELECTRODE REM PT RTRN 9FT ADLT (ELECTROSURGICAL) ×1 IMPLANT
GAUZE SPONGE 4X4 16PLY XRAY LF (GAUZE/BANDAGES/DRESSINGS) ×2 IMPLANT
GLOVE BIO SURGEON STRL SZ 6.5 (GLOVE) ×4 IMPLANT
GLOVE BIO SURGEON STRL SZ7.5 (GLOVE) ×2 IMPLANT
GLOVE BIO SURGEONS STRL SZ 6.5 (GLOVE) ×2
GLOVE BIOGEL PI IND STRL 6.5 (GLOVE) ×1 IMPLANT
GLOVE BIOGEL PI INDICATOR 6.5 (GLOVE) ×2
GLOVE INDICATOR 7.5 STRL GRN (GLOVE) ×2 IMPLANT
GOWN STRL REUS W/ TWL LRG LVL3 (GOWN DISPOSABLE) ×2 IMPLANT
GOWN STRL REUS W/TWL LRG LVL3 (GOWN DISPOSABLE) ×8
LABEL OR SOLS (LABEL) ×3 IMPLANT
MESH HERNIA 6X12 ULTRAPRO MED (Mesh General) IMPLANT
MESH HERNIA ULTRAPRO MED (Mesh General) ×2 IMPLANT
NEEDLE HYPO 22GX1.5 SAFETY (NEEDLE) ×3 IMPLANT
NS IRRIG 500ML POUR BTL (IV SOLUTION) ×3 IMPLANT
PACK BASIN MINOR ARMC (MISCELLANEOUS) ×3 IMPLANT
SUT MNCRL 4-0 (SUTURE) ×2
SUT MNCRL 4-0 27XMFL (SUTURE) ×1
SUT SURGILON 0 BLK (SUTURE) ×4 IMPLANT
SUT VIC AB 2-0 CT2 27 (SUTURE) ×3 IMPLANT
SUT VIC AB 2-0 SH 27 (SUTURE) ×2
SUT VIC AB 2-0 SH 27XBRD (SUTURE) IMPLANT
SUT VIC AB 3-0 SH 27 (SUTURE) ×2
SUT VIC AB 3-0 SH 27X BRD (SUTURE) ×2 IMPLANT
SUTURE MNCRL 4-0 27XMF (SUTURE) ×1 IMPLANT
SYR 10ML LL (SYRINGE) ×3 IMPLANT

## 2017-05-22 NOTE — Discharge Instructions (Signed)
AMBULATORY SURGERY  DISCHARGE INSTRUCTIONS   1) The drugs that you were given will stay in your system until tomorrow so for the next 24 hours you should not:  A) Drive an automobile B) Make any legal decisions C) Drink any alcoholic beverage   2) You may resume regular meals tomorrow.  Today it is better to start with liquids and gradually work up to solid foods.  You may eat anything you prefer, but it is better to start with liquids, then soup and crackers, and gradually work up to solid foods.   3) Please notify your doctor immediately if you have any unusual bleeding, trouble breathing, redness and pain at the surgery site, drainage, fever, or pain not relieved by medication.    4) Additional Instructions:        Please contact your physician with any problems or Same Day Surgery at [phone], Monday through Friday 6 am to 4 pm, or [place] at [facility] number at [phone].AMBULATORY SURGERY  DISCHARGE INSTRUCTIONS   5) The drugs that you were given will stay in your system until tomorrow so for the next 24 hours you should not:  D) Drive an automobile E) Make any legal decisions F) Drink any alcoholic beverage   6) You may resume regular meals tomorrow.  Today it is better to start with liquids and gradually work up to solid foods.  You may eat anything you prefer, but it is better to start with liquids, then soup and crackers, and gradually work up to solid foods.   7) Please notify your doctor immediately if you have any unusual bleeding, trouble breathing, redness and pain at the surgery site, drainage, fever, or pain not relieved by medication.    8) Additional Instructions:        Please contact your physician with any problems or Same Day Surgery at [phone], Monday through Friday 6 am to 4 pm, or [place] at [facility] number at [phone]. Diet: Resume home heart healthy regular diet.   Activity: No heavy lifting >20  pounds (children, pets, laundry, garbage) or strenuous activity until follow-up, but light activity and walking are encouraged. Do not drive or drink alcohol if taking narcotic pain medications.  Wound care: May shower with soapy water and pat dry (do not rub incisions), but no baths or submerging incision underwater until follow-up. (no swimming)   Medications: Resume all home medications. For mild to moderate pain: acetaminophen (Tylenol) or ibuprofen (if no kidney disease). Combining Tylenol with alcohol can substantially increase your risk of causing liver disease. Narcotic pain medications, if prescribed, can be used for severe pain, though may cause nausea, constipation, and drowsiness. If you do not need the narcotic pain medication, you do not need to fill the prescription.  Call office [phone]) at any time if any questions, worsening pain, fevers/chills, bleeding, drainage from incision site, or other concerns.

## 2017-05-22 NOTE — Anesthesia Procedure Notes (Signed)
Procedure Name: Intubation Date/Time: 05/22/2017 7:26 AM Performed by: Nile Riggs, CRNA Pre-anesthesia Checklist: Patient identified, Emergency Drugs available, Suction available, Patient being monitored and Timeout performed Patient Re-evaluated:Patient Re-evaluated prior to induction Oxygen Delivery Method: Circle system utilized Preoxygenation: Pre-oxygenation with 100% oxygen Induction Type: IV induction and Cricoid Pressure applied Ventilation: Oral airway inserted - appropriate to patient size Laryngoscope Size: Sabra Heck and 2 Grade View: Grade I Tube type: Oral Tube size: 7.5 mm Number of attempts: 1 Airway Equipment and Method: Patient positioned with wedge pillow and Stylet Placement Confirmation: ETT inserted through vocal cords under direct vision,  positive ETCO2,  CO2 detector and breath sounds checked- equal and bilateral Secured at: 21 cm Tube secured with: Tape Dental Injury: Teeth and Oropharynx as per pre-operative assessment

## 2017-05-22 NOTE — Op Note (Signed)
Preoperative diagnosis: Right Inguinal Hernia.  Postoperative diagnosis: Right Indirect Inguinal Hernia.  Procedure: Right Inguinal hernia repair with mesh  Anesthesia: General  Surgeon: Dr. Windell Moment  Wound Classification: Clean  Indications:  Patient is a 77 y.o. male developed a symptomatic right inguinal hernia. Repair was indicated to avoid complications of incarceration, obstruction and pain, and a prosthetic mesh repair was elected.  Findings: 1. Vas Deferens and cord structures identified and preserved 2. An indirect inguinal hernia was identified 3. Prolene Hernia System used for repair 4. Adequate hemostasis achieved  Description of procedure: The patient was taken to the operating room. A time-out was completed verifying correct patient, procedure, site, positioning, and implant(s) and/or special equipment prior to beginning this procedure. spinal anesthesia was induced. The right groin was prepped and draped in the usual sterile fashion. An incision was marked in a natural skin crease and planned to end near the pubic tubercle.  The skin crease incision was made with a knife and deepened through Scarpa's and Camper's fascia with electrocautery until the aponeurosis of the external oblique was encountered. This was cleaned and the external ring was exposed. Hemostasis was achieved in the wound. An incision was made in the midportion of the external oblique aponeurosis in the direction of its fibers. The ilioinguinal nerve was identified and protected throughout the dissection. Flaps of the external oblique were developed cephalad and inferiorly.  The cord was identified. It was gently dissected free at the pubic tubercle and encircled with a Penrose drain. Attention was directed to the anteromedial aspect of the cord, where an indirect hernia sac was identified. The sac was carefully dissected free of the cord down to the level of the internal ring. The vas and testicular vessels  were identified and protected from harm. The sac was opened and no contents were found (already reduced). The sac was twisted and suture ligated with 2-0 vicryl. Redundant sac was excised and submitted to pathology. The stump of the sac was checked for hemostasis and allowed to retract into the abdomen.  Attention then turned to the floor of the canal, which appeared to be grossly weakened without a well-defined defect or sac. The Prolene Hernia System mesh was inserted. Beginning at the pubic tubercle, the mesh was sutured to the inguinal ligament inferiorly and the conjoint tendon superiorly using interrupted 0 nonabsorbable sutures. Care was taken to assure that the mesh was placed in a relaxed fashion to avoid excessive tension and that no neurovascular structures were caught in the repair. Laterally, the tails of the mesh were crossed and the internal ring recreated.  Hemostasis was again checked. The Penrose drain was removed. The external oblique aponeurosis was closed with a running suture of 3-0 Vicryl. Scarpa's fascia was closed with interrupted 3-0 Vicryl.  The skin was closed with a subcuticular stitch of Monocryl 4-0. Dermabond was applied.  The testis was gently pulled down into its anatomic position in the scrotum.  The patient tolerated the procedure well and was taken to the postanesthesia care unit in stable condition.   Specimen: Hernia sac  Complications: None  Estimated Blood Loss: 30 mL

## 2017-05-22 NOTE — Anesthesia Preprocedure Evaluation (Signed)
Anesthesia Evaluation  Patient identified by MRN, date of birth, ID band Patient awake    Reviewed: Allergy & Precautions, NPO status , Patient's Chart, lab work & pertinent test results  History of Anesthesia Complications Negative for: history of anesthetic complications  Airway Mallampati: II       Dental   Pulmonary neg sleep apnea, neg COPD, former smoker,           Cardiovascular (-) hypertension(-) Past MI and (-) CHF (-) dysrhythmias (-) Valvular Problems/Murmurs     Neuro/Psych neg Seizures Anxiety parkinsonism    GI/Hepatic Neg liver ROS, GERD (rare)  Medicated,  Endo/Other  neg diabetes  Renal/GU negative Renal ROS     Musculoskeletal   Abdominal   Peds  Hematology   Anesthesia Other Findings   Reproductive/Obstetrics                             Anesthesia Physical Anesthesia Plan  ASA: II  Anesthesia Plan: General   Post-op Pain Management:    Induction:   PONV Risk Score and Plan: 2 and Dexamethasone and Ondansetron  Airway Management Planned: Oral ETT and LMA  Additional Equipment:   Intra-op Plan:   Post-operative Plan:   Informed Consent: I have reviewed the patients History and Physical, chart, labs and discussed the procedure including the risks, benefits and alternatives for the proposed anesthesia with the patient or authorized representative who has indicated his/her understanding and acceptance.     Plan Discussed with:   Anesthesia Plan Comments:         Anesthesia Quick Evaluation

## 2017-05-22 NOTE — Interval H&P Note (Signed)
History and Physical Interval Note:  05/22/2017 7:05 AM  Steven Buckley  has presented today for surgery, with the diagnosis of UNILATERAL INGUINAL HERNIA  The various methods of treatment have been discussed with the patient and family. After consideration of risks, benefits and other options for treatment, the patient has consented to  Procedure(s): HERNIA REPAIR INGUINAL ADULT (Right) as a surgical intervention .  The patient's history has been reviewed, patient examined, no change in status, stable for surgery.  I have reviewed the patient's chart and labs. Right inguinal area marked in the pre procedure room.  Questions were answered to the patient's satisfaction.     Herbert Pun

## 2017-05-22 NOTE — Brief Op Note (Signed)
05/22/2017  9:46 AM  PATIENT:  Steven Buckley  77 y.o. male  PRE-OPERATIVE DIAGNOSIS:  UNILATERAL INGUINAL HERNIA  POST-OPERATIVE DIAGNOSIS:  UNILATERAL INGUINAL HERNIA  PROCEDURE:  Procedure(s): HERNIA REPAIR INGUINAL ADULT (Right)  SURGEON:  Surgeon(s) and Role:    * Herbert Pun, MD - Primary  ANESTHESIA:   local and general  EBL:  30 mL

## 2017-05-22 NOTE — Anesthesia Postprocedure Evaluation (Signed)
Anesthesia Post Note  Patient: Steven Buckley  Procedure(s) Performed: HERNIA REPAIR INGUINAL ADULT (Right Groin)  Patient location during evaluation: PACU Anesthesia Type: General Level of consciousness: awake and alert Pain management: pain level controlled Vital Signs Assessment: post-procedure vital signs reviewed and stable Respiratory status: spontaneous breathing and respiratory function stable Cardiovascular status: stable Anesthetic complications: no     Last Vitals:  Vitals:   05/22/17 0619 05/22/17 0935  BP: 93/67 (!) 161/98  Pulse: 67 92  Resp: 16 11  Temp: 36.4 C 36.8 C  SpO2: 98% 99%    Last Pain:  Vitals:   05/22/17 0935  TempSrc:   PainSc: 0-No pain                 Steven Buckley

## 2017-05-22 NOTE — Transfer of Care (Signed)
Immediate Anesthesia Transfer of Care Note  Patient: Steven Buckley  Procedure(s) Performed: HERNIA REPAIR INGUINAL ADULT (Right Groin)  Patient Location: PACU  Anesthesia Type:General  Level of Consciousness: awake, alert , oriented and patient cooperative  Airway & Oxygen Therapy: Patient Spontanous Breathing and Patient connected to face mask oxygen  Post-op Assessment: Report given to RN, Post -op Vital signs reviewed and stable and Patient moving all extremities  Post vital signs: Reviewed and stable  Last Vitals:  Vitals Value Taken Time  BP 161/98 05/22/2017  9:38 AM  Temp 36.8 C 05/22/2017  9:35 AM  Pulse 94 05/22/2017  9:39 AM  Resp 21 05/22/2017  9:39 AM  SpO2 98 % 05/22/2017  9:39 AM  Vitals shown include unvalidated device data.  Last Pain:  Vitals:   05/22/17 0619  TempSrc: Temporal  PainSc: 0-No pain         Complications: No apparent anesthesia complications

## 2017-05-22 NOTE — Anesthesia Post-op Follow-up Note (Signed)
Anesthesia QCDR form completed.        

## 2017-05-23 LAB — SURGICAL PATHOLOGY

## 2017-05-27 DIAGNOSIS — H401130 Primary open-angle glaucoma, bilateral, stage unspecified: Secondary | ICD-10-CM | POA: Diagnosis not present

## 2017-05-31 DIAGNOSIS — H401130 Primary open-angle glaucoma, bilateral, stage unspecified: Secondary | ICD-10-CM | POA: Diagnosis not present

## 2017-06-11 DIAGNOSIS — H353212 Exudative age-related macular degeneration, right eye, with inactive choroidal neovascularization: Secondary | ICD-10-CM | POA: Diagnosis not present

## 2017-06-11 DIAGNOSIS — H353222 Exudative age-related macular degeneration, left eye, with inactive choroidal neovascularization: Secondary | ICD-10-CM | POA: Diagnosis not present

## 2017-06-13 DIAGNOSIS — Z79899 Other long term (current) drug therapy: Secondary | ICD-10-CM | POA: Diagnosis not present

## 2017-06-13 DIAGNOSIS — E782 Mixed hyperlipidemia: Secondary | ICD-10-CM | POA: Diagnosis not present

## 2017-06-13 DIAGNOSIS — Z125 Encounter for screening for malignant neoplasm of prostate: Secondary | ICD-10-CM | POA: Diagnosis not present

## 2017-06-20 DIAGNOSIS — G20 Parkinson's disease: Secondary | ICD-10-CM | POA: Diagnosis not present

## 2017-06-20 DIAGNOSIS — Z125 Encounter for screening for malignant neoplasm of prostate: Secondary | ICD-10-CM | POA: Diagnosis not present

## 2017-06-20 DIAGNOSIS — G903 Multi-system degeneration of the autonomic nervous system: Secondary | ICD-10-CM | POA: Diagnosis not present

## 2017-06-20 DIAGNOSIS — Z0000 Encounter for general adult medical examination without abnormal findings: Secondary | ICD-10-CM | POA: Diagnosis not present

## 2017-06-20 DIAGNOSIS — E782 Mixed hyperlipidemia: Secondary | ICD-10-CM | POA: Diagnosis not present

## 2017-06-20 DIAGNOSIS — Z79899 Other long term (current) drug therapy: Secondary | ICD-10-CM | POA: Diagnosis not present

## 2017-07-22 DIAGNOSIS — R0781 Pleurodynia: Secondary | ICD-10-CM | POA: Diagnosis not present

## 2017-08-21 DIAGNOSIS — F0280 Dementia in other diseases classified elsewhere without behavioral disturbance: Secondary | ICD-10-CM | POA: Diagnosis not present

## 2017-08-21 DIAGNOSIS — G903 Multi-system degeneration of the autonomic nervous system: Secondary | ICD-10-CM | POA: Diagnosis not present

## 2017-08-21 DIAGNOSIS — G4752 REM sleep behavior disorder: Secondary | ICD-10-CM | POA: Diagnosis not present

## 2017-08-21 DIAGNOSIS — G20 Parkinson's disease: Secondary | ICD-10-CM | POA: Diagnosis not present

## 2017-08-21 DIAGNOSIS — R4189 Other symptoms and signs involving cognitive functions and awareness: Secondary | ICD-10-CM | POA: Diagnosis not present

## 2017-09-03 DIAGNOSIS — H401130 Primary open-angle glaucoma, bilateral, stage unspecified: Secondary | ICD-10-CM | POA: Diagnosis not present

## 2017-09-03 DIAGNOSIS — H353222 Exudative age-related macular degeneration, left eye, with inactive choroidal neovascularization: Secondary | ICD-10-CM | POA: Diagnosis not present

## 2017-09-03 DIAGNOSIS — H353212 Exudative age-related macular degeneration, right eye, with inactive choroidal neovascularization: Secondary | ICD-10-CM | POA: Diagnosis not present

## 2017-09-06 ENCOUNTER — Emergency Department
Admission: EM | Admit: 2017-09-06 | Discharge: 2017-09-06 | Disposition: A | Discharge status: Home / Self Care | Payer: PPO | Attending: Emergency Medicine | Admitting: Emergency Medicine

## 2017-09-06 ENCOUNTER — Other Ambulatory Visit: Payer: Self-pay

## 2017-09-06 DIAGNOSIS — G908 Other disorders of autonomic nervous system: Secondary | ICD-10-CM | POA: Diagnosis not present

## 2017-09-06 DIAGNOSIS — Z79899 Other long term (current) drug therapy: Secondary | ICD-10-CM | POA: Insufficient documentation

## 2017-09-06 DIAGNOSIS — G909 Disorder of the autonomic nervous system, unspecified: Secondary | ICD-10-CM

## 2017-09-06 DIAGNOSIS — Z87891 Personal history of nicotine dependence: Secondary | ICD-10-CM | POA: Diagnosis not present

## 2017-09-06 DIAGNOSIS — I959 Hypotension, unspecified: Secondary | ICD-10-CM | POA: Diagnosis not present

## 2017-09-06 DIAGNOSIS — R55 Syncope and collapse: Secondary | ICD-10-CM | POA: Diagnosis not present

## 2017-09-06 LAB — BASIC METABOLIC PANEL
Anion gap: 7 (ref 5–15)
BUN: 17 mg/dL (ref 8–23)
CO2: 28 mmol/L (ref 22–32)
CREATININE: 1.06 mg/dL (ref 0.61–1.24)
Calcium: 8.6 mg/dL — ABNORMAL LOW (ref 8.9–10.3)
Chloride: 108 mmol/L (ref 98–111)
GFR calc Af Amer: >60 mL/min (ref >60)
GLUCOSE: 94 mg/dL (ref 70–99)
POTASSIUM: 4.3 mmol/L (ref 3.5–5.1)
Sodium: 143 mmol/L (ref 135–145)

## 2017-09-06 LAB — CBC
HCT: 45 % (ref 40.0–52.0)
Hemoglobin: 15.1 g/dL (ref 13.0–18.0)
MCH: 31.4 pg (ref 26.0–34.0)
MCHC: 33.6 g/dL (ref 32.0–36.0)
MCV: 93.4 fL (ref 80.0–100.0)
Platelets: 163 10*3/uL (ref 150–440)
RBC: 4.82 MIL/uL (ref 4.40–5.90)
RDW: 14.4 % (ref 11.5–14.5)
WBC: 5.5 10*3/uL (ref 3.8–10.6)

## 2017-09-06 LAB — TROPONIN I: Troponin I: <0.03 ng/mL (ref <0.03)

## 2017-09-06 NOTE — ED Triage Notes (Signed)
Pt arrives POV from exercising at [employer] [facility] at the [facility]. Pt's wife states that his bp was 75 systolic sitting and was unable to get a reading with the patient standing, pt states that they have been increasing his medications to elevate his pressure.

## 2017-09-06 NOTE — ED Provider Notes (Signed)
[facility] Emergency Department Provider Note  Time seen: 2:25 PM  I have reviewed the triage vital signs and the nursing notes.   HISTORY  Chief Complaint Near Syncope    HPI MYLEN MANGAN III is a 77 y.o. male with a past medical history of gastric reflux, Parkinson's, low blood pressure on Midodrine, presents to the emergency department for low blood pressure and near syncope.  According to the patient he checked his blood pressure this morning it was in the 70s, took his Midrin and it came up so the patient went to the [facility] to work out per his routine.  However while working out he states he became very lightheaded and pale per the wife so they came to the emergency department for evaluation.  Upon arrival to the emergency department patient's blood pressures are normal.  Patient states he feels much better and has no complaints at this time.  Patient is being seen by cardiology for autonomic dysfunction likely related to his Parkinson's disease prescribed Midodrine 3 times daily did not take his midday dose yet.  Denies any chest pain or shortness of breath at any point.  Largely negative review of systems. Past Medical History:  Diagnosis Date  . Anxiety   . Cancer [facility]) 2007   prostate, treated with seeds. skin cancer also  . Dizziness 04/2017   looses balance frequently but no falls  . GERD (gastroesophageal reflux disease)   . Glaucoma    bilateral  . History of kidney stones   . Macular degeneration   . Orthostatic hypotension   . Parkinson's disease ([place])     There are no active problems to display for this patient.   Past Surgical History:  Procedure Laterality Date  . EYE SURGERY Bilateral    cataract extractions  . HAND SURGERY Right    crushed hand in hay baler  . HERNIA REPAIR Left 1949   inguinal repair  . INGUINAL HERNIA REPAIR Right 05/22/2017   Procedure: HERNIA REPAIR INGUINAL ADULT;  Surgeon: Herbert Pun, MD;  Location:  [facility] ORS;  Service: General;  Laterality: Right;  . SHOULDER ARTHROSCOPY Right 1999   rotator cuff repair  . skin cancer removal  2018   squamos cell on nose, basal cell on cheek, back  . TONSILLECTOMY      Prior to Admission medications   Medication Sig Start Date End Date Taking? Authorizing Provider  Bevacizumab (AVASTIN IV) Inject 1 Dose into the eye every 6 (six) weeks. Injection into both eyes every 6 to 11 weeks as indicated for retinal bleeding    [provider]  carbidopa-levodopa (SINEMET CR) 50-200 MG tablet Take 1 tablet by mouth at bedtime.    [provider]  carbidopa-levodopa (SINEMET IR) 25-100 MG tablet Take 2 tablets by mouth 4 (four) times daily.    [provider]  Cholecalciferol (VITAMIN D3) 1000 units CAPS Take 1,000 Units by mouth daily.    [provider]  fludrocortisone (FLORINEF) 0.1 MG tablet Take 0.1 mg by mouth 2 (two) times daily. Given for hypotension related to parkinsons    [provider]  ibuprofen (ADVIL,MOTRIN) 200 MG tablet Take 400 mg by mouth every 6 (six) hours as needed for headache or moderate pain.    [provider]  Inositol Niacinate (NIACIN FLUSH FREE PO) Take 1 tablet by mouth daily.    [provider]  ketoconazole (NIZORAL) 2 % shampoo Apply 1 application topically every 14 (fourteen) days. 04/22/17  [provider]  Multiple Vitamins-Minerals (PRESERVISION AREDS 2 PO) Take 1 capsule by mouth daily.    [provider]  niacin 500 MG tablet Take 1,000 mg by mouth daily.    [provider]  Nutritional Supplements (JUICE PLUS FIBRE PO) Take 6 capsules by mouth daily.    [provider]  Polyethyl Glycol-Propyl Glycol (SYSTANE OP) Place 2 drops into both eyes 3 (three) times daily as needed (for dry eyes).    [provider]  silodosin (RAPAFLO) 8 MG CAPS capsule Take 8 mg by mouth daily as needed (for bladder). TITRATES DOSE AS NEEDED  AND USUALLY TAKES ONLY 1 OF EACH RAPAFLO AND FLOMAX    [provider]  tamsulosin (FLOMAX) 0.4 MG CAPS capsule Take 0.4 mg by mouth daily as needed (for bladder).    [provider]  timolol (BETIMOL) 0.5 % ophthalmic solution Place 1 drop into both eyes daily.    [provider]  vitamin B-12 (CYANOCOBALAMIN) 500 MCG tablet Take 500 mcg by mouth daily. TAKES SUBLINGUILLY    [provider]    No Known Allergies  No family history on file.  Social History Social History   Tobacco Use  . Smoking status: Former Smoker    Packs/day: 1.00    Types: Cigarettes    Last attempt to quit: 01/30/1967    Years since quitting: 50.6  . Smokeless tobacco: Former Systems developer  . Tobacco comment: tried smokeless tobacco but did not like it  Substance Use Topics  . Alcohol use: Yes    Alcohol/week: 1.0 standard drinks    Types: 1 Glasses of wine per week    Comment: once a week, maybe  . Drug use: Never    Review of Systems Constitutional: Negative for fever.  Positive for near syncope but negative for loss of consciousness. Cardiovascular: Negative for chest pain. Respiratory: Negative for shortness of breath. Gastrointestinal: Negative for abdominal pain, vomiting Musculoskeletal: Negative for musculoskeletal complaints Skin: Negative for skin complaints  Neurological: Negative for headache All other ROS negative  ____________________________________________   PHYSICAL EXAM:  VITAL SIGNS: ED Triage Vitals  Enc Vitals Group     BP 09/06/17 1138 101/76     Pulse Rate 09/06/17 1138 70     Resp 09/06/17 1230 11     Temp 09/06/17 1138 (!) 97.5 F (36.4 C)     Temp Source 09/06/17 1138 Oral     SpO2 09/06/17 1138 97 %     Weight 09/06/17 1150 190 lb (86.2 kg)     Height 09/06/17 1150 5\' 11"  (1.803 m)     Head Circumference --      Peak Flow --      Pain Score 09/06/17 1150 0     Pain Loc --      Pain Edu? --      Excl. in [place]? --      Constitutional: Alert and oriented. Well appearing and in no distress. Eyes: Normal exam ENT   Head: Normocephalic and atraumatic.   Mouth/Throat: Mucous membranes are moist. Cardiovascular: Normal rate, regular rhythm.  Respiratory: Normal respiratory effort without tachypnea nor retractions. Breath sounds are clear Gastrointestinal: Soft and nontender. No distention.  Musculoskeletal: Nontender with normal range of motion in all extremities. No lower extremity tenderness or edema. Neurologic:  Normal speech and language. No gross focal neurologic deficits Skin:  Skin is warm, dry and intact.  Psychiatric: Mood and affect are normal.  ____________________________________________    EKG  EKG reviewed and interpreted by myself shows sinus rhythm at 67 bpm with a narrow QRS, normal axis, normal intervals, no concerning ST changes.  ____________________________________________   INITIAL IMPRESSION / ASSESSMENT AND PLAN / ED COURSE  Pertinent labs & imaging results that were available during my care of the patient were reviewed by me and considered in my medical decision making (see chart for details).  Patient presents to the emergency department after near syncopal episode.  Differential would include autonomic dysfunction, dehydration, ACS.  Patient's blood pressures in the emergency department have been very reassuring, labs have resulted largely within normal limits including negative troponin.  Highly suspect autonomic dysfunction.  Patient will follow-up with his primary care doctor.  I discussed return precautions for any significant events.  ____________________________________________   FINAL CLINICAL IMPRESSION(S) / ED DIAGNOSES  Hypotension, resolved Autonomic dysfunction    Harvest Dark, MD 09/06/17 1428

## 2017-09-06 NOTE — ED Notes (Addendum)
.   Pt is resting, Respirations even and unlabored, NAD. Stretcher lowest postion and locked. Call bell within reach. Denies any needs at this time RN will continue to monitor.  Family at bedside.  

## 2017-09-06 NOTE — ED Notes (Signed)
EDP speaking with daughter at this time.

## 2017-09-06 NOTE — ED Notes (Addendum)
Pt wife came to desk stating Pt is restless and ready to leave. Pt has been in room for 40 mins. Pt states,"He is fine now." EDP aware

## 2017-09-06 NOTE — ED Notes (Signed)
.   Pt is resting, Respirations even and unlabored, NAD. Stretcher lowest postion and locked. Call bell within reach. Denies any needs at this time RN will continue to monitor.    

## 2017-09-09 DIAGNOSIS — Z125 Encounter for screening for malignant neoplasm of prostate: Secondary | ICD-10-CM | POA: Diagnosis not present

## 2017-09-09 DIAGNOSIS — C61 Malignant neoplasm of prostate: Secondary | ICD-10-CM | POA: Diagnosis not present

## 2017-09-09 DIAGNOSIS — D400 Neoplasm of uncertain behavior of prostate: Secondary | ICD-10-CM | POA: Diagnosis not present

## 2017-09-09 DIAGNOSIS — R350 Frequency of micturition: Secondary | ICD-10-CM | POA: Diagnosis not present

## 2017-09-13 DIAGNOSIS — G20 Parkinson's disease: Secondary | ICD-10-CM | POA: Diagnosis not present

## 2017-09-13 DIAGNOSIS — G903 Multi-system degeneration of the autonomic nervous system: Secondary | ICD-10-CM | POA: Diagnosis not present

## 2017-09-25 DIAGNOSIS — G903 Multi-system degeneration of the autonomic nervous system: Secondary | ICD-10-CM | POA: Diagnosis not present

## 2017-10-16 DIAGNOSIS — M5116 Intervertebral disc disorders with radiculopathy, lumbar region: Secondary | ICD-10-CM | POA: Diagnosis not present

## 2017-10-16 DIAGNOSIS — M1611 Unilateral primary osteoarthritis, right hip: Secondary | ICD-10-CM | POA: Diagnosis not present

## 2017-10-24 DIAGNOSIS — M1611 Unilateral primary osteoarthritis, right hip: Secondary | ICD-10-CM | POA: Diagnosis not present

## 2017-10-24 DIAGNOSIS — M5116 Intervertebral disc disorders with radiculopathy, lumbar region: Secondary | ICD-10-CM | POA: Diagnosis not present

## 2017-10-24 DIAGNOSIS — M7061 Trochanteric bursitis, right hip: Secondary | ICD-10-CM | POA: Diagnosis not present

## 2017-10-24 DIAGNOSIS — M25551 Pain in right hip: Secondary | ICD-10-CM | POA: Diagnosis not present

## 2017-10-24 DIAGNOSIS — M79604 Pain in right leg: Secondary | ICD-10-CM | POA: Diagnosis not present

## 2017-11-01 DIAGNOSIS — H02054 Trichiasis without entropian left upper eyelid: Secondary | ICD-10-CM | POA: Diagnosis not present

## 2017-11-07 DIAGNOSIS — R634 Abnormal weight loss: Secondary | ICD-10-CM | POA: Diagnosis not present

## 2017-11-07 DIAGNOSIS — M25551 Pain in right hip: Secondary | ICD-10-CM | POA: Diagnosis not present

## 2017-11-07 DIAGNOSIS — M5116 Intervertebral disc disorders with radiculopathy, lumbar region: Secondary | ICD-10-CM | POA: Diagnosis not present

## 2017-11-09 DIAGNOSIS — W010XXA Fall on same level from slipping, tripping and stumbling without subsequent striking against object, initial encounter: Secondary | ICD-10-CM | POA: Diagnosis not present

## 2017-11-09 DIAGNOSIS — S299XXA Unspecified injury of thorax, initial encounter: Secondary | ICD-10-CM | POA: Diagnosis not present

## 2017-11-09 DIAGNOSIS — S81812A Laceration without foreign body, left lower leg, initial encounter: Secondary | ICD-10-CM | POA: Diagnosis not present

## 2017-11-09 DIAGNOSIS — R0781 Pleurodynia: Secondary | ICD-10-CM | POA: Diagnosis not present

## 2017-11-09 DIAGNOSIS — R29898 Other symptoms and signs involving the musculoskeletal system: Secondary | ICD-10-CM | POA: Diagnosis not present

## 2017-11-11 ENCOUNTER — Other Ambulatory Visit: Payer: Self-pay

## 2017-11-11 NOTE — Patient Outreach (Signed)
[facility] [facility]) Care Management  11/11/2017  Steven Buckley 06-10-1940 [id number]  Nurse Call Line Referral Date: 10/141/9 Reason for Referral: chest pain and fall Nurse call line recommendation: "go to emergency room" Attempt #1  Telephone call to patient regarding referral. Unable to reach patient. HIPAA compliant voice message left with call back phone number.   PLAN: RNCM will attempt 2nd telephone call to patient within 4 business days. RNCM will send outreach letter.   Quinn Plowman RN,BSN, CCM [facility] Telephonic  [phone]       PLAN:   Quinn Plowman RN,BSN,CCM [facility] Telephonic  [phone]

## 2017-11-12 DIAGNOSIS — C44212 Basal cell carcinoma of skin of right ear and external auricular canal: Secondary | ICD-10-CM | POA: Diagnosis not present

## 2017-11-12 DIAGNOSIS — X32XXXA Exposure to sunlight, initial encounter: Secondary | ICD-10-CM | POA: Diagnosis not present

## 2017-11-12 DIAGNOSIS — L821 Other seborrheic keratosis: Secondary | ICD-10-CM | POA: Diagnosis not present

## 2017-11-12 DIAGNOSIS — D485 Neoplasm of uncertain behavior of skin: Secondary | ICD-10-CM | POA: Diagnosis not present

## 2017-11-12 DIAGNOSIS — L570 Actinic keratosis: Secondary | ICD-10-CM | POA: Diagnosis not present

## 2017-11-12 DIAGNOSIS — Z08 Encounter for follow-up examination after completed treatment for malignant neoplasm: Secondary | ICD-10-CM | POA: Diagnosis not present

## 2017-11-12 DIAGNOSIS — Z85828 Personal history of other malignant neoplasm of skin: Secondary | ICD-10-CM | POA: Diagnosis not present

## 2017-11-13 ENCOUNTER — Other Ambulatory Visit: Payer: Self-pay

## 2017-11-13 NOTE — Patient Outreach (Addendum)
[place] [facility]) Care Management  11/13/2017  Steven Buckley August 06, 1940 [id number]  Nurse Call Line Referral Date: 10/141/9 Reason for Referral: chest pain and fall Nurse call line recommendation: "go to emergency room" Attempt #2  Telephone call to patient regarding referral. Unable to reach patient. HIPAA compliant voice message left with call back phone number. Attempted call to listed home number.  Phone only rang.  Attempted call to listed mobile number. Unable to leave voice message due to patients mailbox not being set up.   PLAN: RNCM will attempt 3rd telephone call to patient within 4 business days.   Quinn Plowman RN,BSN, [name] Telephonic  [phone]

## 2017-11-15 ENCOUNTER — Other Ambulatory Visit: Payer: Self-pay

## 2017-11-15 DIAGNOSIS — M545 Low back pain: Secondary | ICD-10-CM | POA: Diagnosis not present

## 2017-11-15 DIAGNOSIS — R29898 Other symptoms and signs involving the musculoskeletal system: Secondary | ICD-10-CM | POA: Diagnosis not present

## 2017-11-15 DIAGNOSIS — M4727 Other spondylosis with radiculopathy, lumbosacral region: Secondary | ICD-10-CM | POA: Diagnosis not present

## 2017-11-15 DIAGNOSIS — M9973 Connective tissue and disc stenosis of intervertebral foramina of lumbar region: Secondary | ICD-10-CM | POA: Diagnosis not present

## 2017-11-15 NOTE — Patient Outreach (Signed)
[place] [facility]) Care Management  11/15/2017  Steven Buckley 07-May-1940 [id number]  Nurse Call Line Referral Date: 10/141/9 Reason for Referral: chest pain and fall Nurse call line recommendation: "go to emergency room" Attempt #3  Telephone call to patient regarding referral. Unable to reach patient. HIPAA compliant voice message left with call back phone number.   PLAN: If no return call RNCM will proceed with closure .   Quinn Plowman RN,BSN, [place] Telephonic  [phone]

## 2017-11-18 ENCOUNTER — Other Ambulatory Visit: Payer: Self-pay | Admitting: Surgery

## 2017-11-18 ENCOUNTER — Ambulatory Visit: Payer: Self-pay

## 2017-11-18 DIAGNOSIS — M545 Low back pain, unspecified: Secondary | ICD-10-CM

## 2017-11-22 ENCOUNTER — Ambulatory Visit
Admission: RE | Admit: 2017-11-22 | Discharge: 2017-11-22 | Disposition: A | Discharge status: Home / Self Care | Payer: PPO | Source: Ambulatory Visit | Attending: Surgery | Admitting: Surgery

## 2017-11-22 DIAGNOSIS — M4316 Spondylolisthesis, lumbar region: Secondary | ICD-10-CM | POA: Diagnosis not present

## 2017-11-22 DIAGNOSIS — M4727 Other spondylosis with radiculopathy, lumbosacral region: Secondary | ICD-10-CM | POA: Diagnosis not present

## 2017-11-22 DIAGNOSIS — M545 Low back pain, unspecified: Secondary | ICD-10-CM

## 2017-11-22 DIAGNOSIS — M48061 Spinal stenosis, lumbar region without neurogenic claudication: Secondary | ICD-10-CM | POA: Insufficient documentation

## 2017-11-25 ENCOUNTER — Other Ambulatory Visit: Payer: Self-pay

## 2017-11-25 DIAGNOSIS — M5416 Radiculopathy, lumbar region: Secondary | ICD-10-CM | POA: Diagnosis not present

## 2017-11-25 DIAGNOSIS — M5136 Other intervertebral disc degeneration, lumbar region: Secondary | ICD-10-CM | POA: Diagnosis not present

## 2017-11-25 NOTE — Patient Outreach (Signed)
[facility] [facility]) Care Management  11/25/2017  Steven Buckley [birth year] [id number]  Nurse Call Line Referral Date:10/141/9 Reason for Referral: chest pain and fall Nurse call line recommendation:"go to emergency room"  Telephone call to patient regarding nurse call line follow up. HIPAA verified. Explained reason for call. Patient states he fell and hit his chest. He states he was having pain to his chest and that is why he called the nurse line. Patient reports going to the walk in clinic as advised by the nurse call line.  Patient reports chest xray was done. Denies any broken ribs.  Patient states he is still sore but not as bad. Patient states he is being treated for Parkinson's. He states he has had 2 falls within the past 3-4 years. Patient states he is aware of safety at home. He states he and his wife are on a waiting list for senior housing.  Patient denied any further needs at this time.  Patient states, " I'm cool."  RNCM advised patient to contact the nurse call line if needed.  RNCM advised patient to notify MD of any changes in condition prior to scheduled appointment. RNCM verified patient aware of 911 services for urgent/ emergent needs.,  PLAN:  RNCM will close patient due to patient being assessed and having no further needs.  RNCM will send notification of closure to patients primary MD.   Quinn Plowman RN,BSN,CCM [facility] Telephonic  [phone]

## 2017-11-26 DIAGNOSIS — H353222 Exudative age-related macular degeneration, left eye, with inactive choroidal neovascularization: Secondary | ICD-10-CM | POA: Diagnosis not present

## 2017-11-26 DIAGNOSIS — H353212 Exudative age-related macular degeneration, right eye, with inactive choroidal neovascularization: Secondary | ICD-10-CM | POA: Diagnosis not present

## 2017-11-27 ENCOUNTER — Ambulatory Visit: Payer: PPO | Admitting: Physical Therapy

## 2017-11-28 DIAGNOSIS — H401132 Primary open-angle glaucoma, bilateral, moderate stage: Secondary | ICD-10-CM | POA: Diagnosis not present

## 2017-11-29 ENCOUNTER — Ambulatory Visit: Payer: PPO | Admitting: Physical Therapy

## 2017-12-05 ENCOUNTER — Ambulatory Visit: Payer: PPO

## 2017-12-09 ENCOUNTER — Ambulatory Visit: Payer: PPO

## 2017-12-09 ENCOUNTER — Ambulatory Visit: Payer: PPO | Admitting: Physical Therapy

## 2017-12-11 ENCOUNTER — Encounter: Payer: PPO | Admitting: Physical Therapy

## 2017-12-12 ENCOUNTER — Ambulatory Visit: Payer: PPO | Attending: Family Medicine

## 2017-12-12 DIAGNOSIS — M6281 Muscle weakness (generalized): Secondary | ICD-10-CM | POA: Diagnosis not present

## 2017-12-12 DIAGNOSIS — R262 Difficulty in walking, not elsewhere classified: Secondary | ICD-10-CM | POA: Insufficient documentation

## 2017-12-12 DIAGNOSIS — M545 Low back pain: Secondary | ICD-10-CM | POA: Diagnosis not present

## 2017-12-12 DIAGNOSIS — M5417 Radiculopathy, lumbosacral region: Secondary | ICD-10-CM | POA: Diagnosis not present

## 2017-12-12 DIAGNOSIS — G8929 Other chronic pain: Secondary | ICD-10-CM | POA: Insufficient documentation

## 2017-12-12 DIAGNOSIS — Z9181 History of falling: Secondary | ICD-10-CM | POA: Diagnosis not present

## 2017-12-12 NOTE — Patient Instructions (Signed)
Pt was recommended to perform scapular retractions 10x3 daily to promote thoracic extension and decrease pressure to low back. Wife demonstrated understanding and helped pt.

## 2017-12-12 NOTE — Therapy (Signed)
[facility] [address] [place], [address] Phone: [phone]   Fax:  [phone]  Physical Therapy Evaluation  Patient Details  Name: Steven Buckley MRN: [id number] Date of Birth: 02-14-1940 Referring Provider (PT): Allene Dillon, NP   Encounter Date: 12/12/2017  PT End of Session - 12/12/17 0935    Visit Number  1    Number of Visits  13    Date for PT Re-Evaluation  01/23/18    Authorization Type  1    Authorization Time Period  of 10 progress report    PT Start Time  0935    PT Stop Time  1039    PT Time Calculation (min)  64 min    Equipment Utilized During Treatment  Gait belt    Activity Tolerance  Patient tolerated treatment well    Behavior During Therapy  [facility] for tasks assessed/performed       Past Medical History:  Diagnosis Date  . Anxiety   . Cancer [facility]) 2007   prostate, treated with seeds. skin cancer also  . Dizziness 04/2017   looses balance frequently but no falls  . GERD (gastroesophageal reflux disease)   . Glaucoma    bilateral  . History of kidney stones   . Macular degeneration   . Orthostatic hypotension   . Parkinson's disease [facility])     Past Surgical History:  Procedure Laterality Date  . EYE SURGERY Bilateral    cataract extractions  . HAND SURGERY Right    crushed hand in hay baler  . HERNIA REPAIR Left 1949   inguinal repair  . INGUINAL HERNIA REPAIR Right 05/22/2017   Procedure: HERNIA REPAIR INGUINAL ADULT;  Surgeon: Herbert Pun, MD;  Location: [facility] ORS;  Service: General;  Laterality: Right;  . SHOULDER ARTHROSCOPY Right 1999   rotator cuff repair  . skin cancer removal  2018   squamos cell on nose, basal cell on cheek, back  . TONSILLECTOMY      There were no vitals filed for this visit.   Subjective Assessment - 12/12/17 0938    Subjective  Back pain: 1/10 currently. 9/10 at worst     Pertinent History  Pt states that he is not  sure what PT is going to do.  Lumbar radiculitis. Pt states that he has drop foot on R. Unable to DF his R foot. His ER visit was 5-6 weeks ago in which he had a lot of pain in his R lateral leg (L5 dermatome).  Had a shot R hip outpatient from Dr. Roland Rack which did not help until 4-5 days after. Pain disappeared. Pt was also taking prednisone.  Got an MRI and was referred to Dr. Sharlet Salina. Unable to see Dr. Sharlet Salina.  His nurse mentioned that he has arthritis. His drop foot bothers him the most. Feels like he is flopping his R foot out to the side.  Uses a SPC sporadically (large base). Also has a rollator.  Has had drop foot starting August 2019.  Back also hurts all the time.  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    Pt also states that he has Parkinson's. Also lost 60 to 70 lbs for about 2 years. [facility] [employer] [facility].  Pt however gradually got weaker.  Dr. Sabra Heck, Dr. Manuella Ghazi knows about the loss in weight and the increased weakness.    Lethargy,  weakness, lack of stamina bothers him.  Planning to move into a retirement community.     Last time Pt felt R lateral hip and thigh pain was about 2-3 weeks ago. Last time pt took prednisone medicaiton was about 3-4 weeks ago.     Patient Stated Goals  Be able to walk better.     Currently in Pain?  Yes    Pain Score  1     Pain Location  Back   and R LE   Pain Orientation  Right    Pain Type  Chronic pain    Pain Onset  More than a month ago    Pain Frequency  Occasional    Aggravating Factors   Standing about 5 minutes, walking (bothered his R hip and leg)    Pain Relieving Factors  Sitting, steroid and prednisone         [facility] PT Assessment - 12/12/17 0934      Assessment   Medical Diagnosis  lumbar radiculitis    Referring Provider (PT)  Loree Fee Meeler, NP    Onset Date/Surgical Date  11/25/17    Prior Therapy  no known PT for current condition      Precautions   Precaution Comments  fall risk       Restrictions   Other Position/Activity Restrictions  no known weight bearing restrictions      Balance Screen   Has the patient fallen in the past 6 months  Yes    How many times?  2   L side tender, no fx; due to R drop foot   Has the patient had a decrease in activity level because of a fear of falling?   No   decreased activity due to R foot     Observation/Other Assessments   Focus on Therapeutic Outcomes (FOTO)   Lumbar spine FOTO: 29      Posture/Postural Control   Posture Comments  forward flexed, B protracted shoulders and neck, middle thoracic flexion, R side bend, protracted neck, slight R trunk rotation, decreased B hip extension. R foot in ER. R lumbar covexity around L3/4      AROM   Overall AROM Comments  R ankle DF: -13, AROM, -10 degrees AAROM    Lumbar Flexion  limited with L lateral hip catch    Lumbar Extension  very limited    Lumbar - Right Side Bend  Limited with L lateral hip discomfort    Lumbar - Left Side Bend  WFL with L hip joint discomfort    Lumbar - Right Rotation  limited   performed in sitting   Lumbar - Left Rotation  WFL   performed in sitting     Strength   Right Hip Flexion  4-/5    Right Hip Extension  4+/5   seated manually resisted   Right Hip ABduction  4-/5   seated clamshell   Left Hip Flexion  4/5    Left Hip Extension  4+/5   seated manually resisted   Left Hip ABduction  4-/5   seated clamshell   Right Knee Flexion  4/5    Right Knee Extension  5/5    Left Knee Flexion  4/5    Left Knee Extension  5/5    Right Ankle Dorsiflexion  3-/5   4-/5 at available range, tight soleus feeling   Left Ankle Dorsiflexion  4-/5      Palpation   Palpation comment  Gastroc/soleus tightness with R  ankle DF. Decrased R ankle joint mobility      Special Tests   Other special tests  TUG without AD: 11.2 seconds average   unsteadiness with increased speed     Transfers   Comments  Mod I with sit <> stand, min/mod A with supine to  sit. Independent sit to supine      Ambulation/Gait   Gait Comments  decreased stance R LE during stance phase, decreased R ankle DF during heel strike, slight unsteadiness.                 Objective measurements completed on examination: See above findings.     Blood pressure L arm sitting, mechanically taken, normal cuff: 114/75, HR 74   Denies ostoeporosis.     TUG without AD:   9.6 seconds but unsteady due to R foot drop, 13 seconds, 11.05 seconds (11.2 seconds average)   Gave B scapular retraction 10x3 as part of his HEP to promote thoracic extension and decrease low back extension pressure. Pt wife (helping husband) demonstrated and verbalized understanding.    Patient is a 76 year old male who came to physical therapy secondary to low back pain with radiating symptoms. He also presents with altered gait pattern and posture, B LE weakness, decreased R ankle DF ROM, decreased R ankle joint mobility, R gastroc/soleus tightness, increased fall risk, and difficulty performing tasks such as walking and tolerating positions such as standing due to back pain. Patient will benefit from skilled physical therapy services to address the aforementioned deficits.        PT Education - 12/12/17 1221    Education Details  ther-ex, HEP    Person(s) Educated  Patient    Methods  Explanation;Demonstration;Tactile cues;Verbal cues    Comprehension  Returned demonstration;Verbalized understanding       PT Short Term Goals - 12/12/17 1327      PT SHORT TERM GOAL #1   Title  Pt will be independent with his HEP to decrease back pain, improve LE strength, decrease fall risk.     Time  3    Period  Weeks    Status  New    Target Date  01/02/18        PT Long Term Goals - 12/12/17 1328      PT LONG TERM GOAL #1   Title  Patient will have a decrease in back pain to 5/10 or less at worst to promote ability to ambulate, perform standing tasks more comfortably.     Baseline   9/10 back pain at worst for the past 3 months (12/12/2017)    Time  6    Period  Weeks    Status  New    Target Date  01/23/18      PT LONG TERM GOAL #2   Title  Pt will improve R ankle DF AROM to 0 degrees AROM or more to promote foot clearance during gait and decrease fall risk.     Baseline  R ankle DF AROM: -13 degrees (12/12/2017)    Time  6    Period  Weeks    Status  New    Target Date  01/23/18      PT LONG TERM GOAL #3   Title  Pt will improve bilateral glute med and max strength by at least 1/2 MMT grade to promote ability to ambulate, perform standing tasks more comfortably for his back.     Baseline  hip extension 4+/5 bilaterally,  hip abduction 4-/5 bilaterally. 12/12/2017    Time  6    Period  Weeks    Status  New    Target Date  01/23/18      PT LONG TERM GOAL #4   Title  Pt will improve his lumbar spine FOTO by at least 10 points as a demonstration of improved function.     Baseline  lumbar spine FOTO 29 (12/12/2017)     Time  6    Period  Weeks    Status  New    Target Date  01/23/18             Plan - 12/12/17 1221    Clinical Impression Statement  Patient is a 77 year old male who came to physical therapy secondary to low back pain with radiating symptoms. He also presents with altered gait pattern and posture, B LE weakness, decreased R ankle DF ROM, decreased R ankle joint mobility, R gastroc/soleus tightness, increased fall risk, and difficulty performing tasks such as walking and tolerating positions such as standing due to back pain. Patient will benefit from skilled physical therapy services to address the aforementioned deficits.     History and Personal Factors relevant to plan of care:  Chronicity of back pain, weakness, LE radiating symptoms, decreased R ankle DF, fall risk, Parkinsons, difficulty walking and tolerateting positions such as standing.     Clinical Presentation  Evolving    Clinical Presentation due to:  weakness seems to be  gradually getting worse based on subjective reports even though pt had some relief from pain from steroid medications/shot    Clinical Decision Making  Moderate    Rehab Potential  Fair    Clinical Impairments Affecting Rehab Potential  (-) Chronicity of back pain, Parkinson's. (+) good wife support.     PT Frequency  2x / week    PT Duration  6 weeks    PT Treatment/Interventions  Therapeutic activities;Therapeutic exercise;Balance training;Neuromuscular re-education;Patient/family education;Manual techniques;Dry needling;Aquatic Therapy;Electrical Stimulation;Iontophoresis 4mg /ml Dexamethasone;Gait training    PT Next Visit Plan  thoracic extension, scapular strengthing, glute med and max strengthening, ankle mobility, DF stretches, balance, manual techniques, modalities PRN    Consulted and Agree with Plan of Care  Patient;Family member/caregiver    Family Member Consulted  wife       Patient will benefit from skilled therapeutic intervention in order to improve the following deficits and impairments:  Pain, Improper body mechanics, Postural dysfunction, Decreased strength, Difficulty walking, Decreased range of motion, Decreased balance  Visit Diagnosis: Chronic bilateral low back pain, unspecified whether sciatica present - Plan: PT plan of care cert/re-cert  Radiculopathy, lumbosacral region - Plan: PT plan of care cert/re-cert  Muscle weakness (generalized) - Plan: PT plan of care cert/re-cert  History of falling - Plan: PT plan of care cert/re-cert  Difficulty in walking, not elsewhere classified - Plan: PT plan of care cert/re-cert     Problem List There are no active problems to display for this patient.   Joneen Boers PT, DPT   12/12/2017, 1:58 PM  [facility] [address] [place], [address] Phone: [phone]   Fax:  [phone]  Name: Steven Buckley MRN: [id number] Date of Birth:  February 26, 1940

## 2017-12-16 ENCOUNTER — Ambulatory Visit: Payer: PPO

## 2017-12-18 ENCOUNTER — Ambulatory Visit: Payer: PPO

## 2017-12-18 DIAGNOSIS — G8929 Other chronic pain: Secondary | ICD-10-CM

## 2017-12-18 DIAGNOSIS — M6281 Muscle weakness (generalized): Secondary | ICD-10-CM

## 2017-12-18 DIAGNOSIS — M545 Low back pain, unspecified: Secondary | ICD-10-CM

## 2017-12-18 DIAGNOSIS — Z9181 History of falling: Secondary | ICD-10-CM

## 2017-12-18 DIAGNOSIS — M5417 Radiculopathy, lumbosacral region: Secondary | ICD-10-CM

## 2017-12-18 DIAGNOSIS — R262 Difficulty in walking, not elsewhere classified: Secondary | ICD-10-CM

## 2017-12-18 NOTE — Therapy (Signed)
[facility] [address], [place], [address] Phone: [phone]   Fax:  [phone]  Physical Therapy Treatment  Patient Details  Name: Steven Buckley MRN: [id number] Date of Birth: 1940-09-04 Referring Provider (PT): Allene Dillon, NP   Encounter Date: 12/18/2017  PT End of Session - 12/18/17 1549    Visit Number  2    Number of Visits  13    Date for PT Re-Evaluation  01/23/18    Authorization Type  2    Authorization Time Period  of 10 progress report    PT Start Time  5176    PT Stop Time  1642    PT Time Calculation (min)  51 min    Equipment Utilized During Treatment  Gait belt    Activity Tolerance  Patient tolerated treatment well    Behavior During Therapy  [facility] for tasks assessed/performed       Past Medical History:  Diagnosis Date  . Anxiety   . Cancer [facility]) 2007   prostate, treated with seeds. skin cancer also  . Dizziness 04/2017   looses balance frequently but no falls  . GERD (gastroesophageal reflux disease)   . Glaucoma    bilateral  . History of kidney stones   . Macular degeneration   . Orthostatic hypotension   . Parkinson's disease [facility])     Past Surgical History:  Procedure Laterality Date  . EYE SURGERY Bilateral    cataract extractions  . HAND SURGERY Right    crushed hand in hay baler  . HERNIA REPAIR Left 1949   inguinal repair  . INGUINAL HERNIA REPAIR Right 05/22/2017   Procedure: HERNIA REPAIR INGUINAL ADULT;  Surgeon: Herbert Pun, MD;  Location: [facility] ORS;  Service: General;  Laterality: Right;  . SHOULDER ARTHROSCOPY Right 1999   rotator cuff repair  . skin cancer removal  2018   squamos cell on nose, basal cell on cheek, back  . TONSILLECTOMY      There were no vitals filed for this visit.  Subjective Assessment - 12/18/17 1552    Subjective  Back and R foot are in pretty good shape. Better able to lift his R foot. Pt states that his  foot drop comes and goes. Saw Dr. Sharlet Salina yesterday. Pt decided to skip the injection.      Pertinent History  Pt states that he is not sure what PT is going to do.  Lumbar radiculitis. Pt states that he has drop foot on R. Unable to DF his R foot. His ER visit was 5-6 weeks ago in which he had a lot of pain in his R lateral leg (L5 dermatome).  Had a shot R hip outpatient from Dr. Roland Rack which did not help until 4-5 days after. Pain disappeared. Pt was also taking prednisone.  Got an MRI and was referred to Dr. Sharlet Salina. Unable to see Dr. Sharlet Salina.  His nurse mentioned that he has arthritis. His drop foot bothers him the most. Feels like he is flopping his R foot out to the side.  Uses a SPC sporadically (large base). Also has a rollator.  Has had drop foot starting August 2019.  Back also hurts all the time.  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    Pt also states that he has Parkinson's. Also lost 60 to 70 lbs for about 2 years. Pt also went to the [employer] 5  days a week.  Pt however gradually got weaker.  Dr. Sabra Heck, Dr. Manuella Ghazi knows about the loss in weight and the increased weakness.    Lethargy, weakness, lack of stamina bothers him.  Planning to move into a retirement community.     Last time Pt felt R lateral hip and thigh pain was about 2-3 weeks ago. Last time pt took prednisone medicaiton was about 3-4 weeks ago.     Patient Stated Goals  Be able to walk better.     Currently in Pain?  No/denies    Pain Score  0-No pain    Pain Onset  More than a month ago                               PT Education - 12/18/17 1648    Education Details  ther-ex, HEP    Person(s) Educated  Patient    Methods  Explanation;Demonstration;Tactile cues;Verbal cues;Handout    Comprehension  Returned demonstration;Verbalized understanding        Objective    Medbridge  Access Code: [email or web address]    Denies ostoeporosis. No latex allergies   Therapeutic  exercise  Seated scapular retraction 10x5 seconds   Then with yellow band resistance 10x2 with 5 second holds  Seated transversus abdominis contraction 10x 5 seconds for 2 sets  Seated B shoulder extension isometrics 10x2 with 5 second holds   Seated R ankle DF AAROM 5x5 seconds  Standing R LE leg press resisting double blue band with B UE assist 10x2  Standing R gastroc stretch at wall 30 seconds x 3    Improved exercise technique, movement at target joints, use of target muscles after mod verbal, visual, tactile cues.       Manual therapy  Seated A to P to R ankle grade 3 to promote ankle DF ROM  Sitting with R ankle in DF (heel slide)   STM to soleus muscle   Improved R ankle DF AROM observed compared to previous session. Continued working on thoracic extension, trunk and glute strengthening to help decrease extension pressure to low back as well as worked on ankle joint mobility and decreasing soleus muscle tension to promote ankle DF ROM. Pt tolerated session well without aggravation of symptoms.         PT Short Term Goals - 12/12/17 1327      PT SHORT TERM GOAL #1   Title  Pt will be independent with his HEP to decrease back pain, improve LE strength, decrease fall risk.     Time  3    Period  Weeks    Status  New    Target Date  01/02/18        PT Long Term Goals - 12/12/17 1328      PT LONG TERM GOAL #1   Title  Patient will have a decrease in back pain to 5/10 or less at worst to promote ability to ambulate, perform standing tasks more comfortably.     Baseline  9/10 back pain at worst for the past 3 months (12/12/2017)    Time  6    Period  Weeks    Status  New    Target Date  01/23/18      PT LONG TERM GOAL #2   Title  Pt will improve R ankle DF AROM to 0 degrees AROM or more to promote foot clearance during gait and decrease fall risk.  Baseline  R ankle DF AROM: -13 degrees (12/12/2017)    Time  6    Period  Weeks    Status  New     Target Date  01/23/18      PT LONG TERM GOAL #3   Title  Pt will improve bilateral glute med and max strength by at least 1/2 MMT grade to promote ability to ambulate, perform standing tasks more comfortably for his back.     Baseline  hip extension 4+/5 bilaterally, hip abduction 4-/5 bilaterally. 12/12/2017    Time  6    Period  Weeks    Status  New    Target Date  01/23/18      PT LONG TERM GOAL #4   Title  Pt will improve his lumbar spine FOTO by at least 10 points as a demonstration of improved function.     Baseline  lumbar spine FOTO 29 (12/12/2017)     Time  6    Period  Weeks    Status  New    Target Date  01/23/18            Plan - 12/18/17 1649    Clinical Impression Statement  Improved R ankle DF AROM observed compared to previous session. Continued working on thoracic extension, trunk and glute strengthening to help decrease extension pressure to low back as well as worked on ankle joint mobility and decreasing soleus muscle tension to promote ankle DF ROM. Pt tolerated session well without aggravation of symptoms.     Rehab Potential  Fair    Clinical Impairments Affecting Rehab Potential  (-) Chronicity of back pain, Parkinson's. (+) good wife support.     PT Frequency  2x / week    PT Duration  6 weeks    PT Treatment/Interventions  Therapeutic activities;Therapeutic exercise;Balance training;Neuromuscular re-education;Patient/family education;Manual techniques;Dry needling;Aquatic Therapy;Electrical Stimulation;Iontophoresis 4mg /ml Dexamethasone;Gait training    PT Next Visit Plan  thoracic extension, scapular strengthing, glute med and max strengthening, ankle mobility, DF stretches, balance, manual techniques, modalities PRN    Consulted and Agree with Plan of Care  Patient;Family member/caregiver    Family Member Consulted  wife       Patient will benefit from skilled therapeutic intervention in order to improve the following deficits and impairments:  Pain,  Improper body mechanics, Postural dysfunction, Decreased strength, Difficulty walking, Decreased range of motion, Decreased balance  Visit Diagnosis: Chronic bilateral low back pain, unspecified whether sciatica present  Radiculopathy, lumbosacral region  Muscle weakness (generalized)  History of falling  Difficulty in walking, not elsewhere classified     Problem List There are no active problems to display for this patient.   Joneen Boers PT, DPT   12/18/2017, 4:54 PM  [facility] [address], [place], [address] Phone: [phone]   Fax:  [phone]  Name: Steven Buckley MRN: [id number] Date of Birth: [birth year]

## 2017-12-18 NOTE — Patient Instructions (Addendum)
   Sitting on a chair    Press your hands on your thighs to feel your abdominal muscles contract.   Hold for 5 seconds comfortably. (Count out loud)    Repeat 10 times.   Perform 3 sets daily.      Medbridge  Access Code: [email or web address]    Seated transversus abdominis 10x3 with 5 seconds   Standing gastroc stretch R 30 seconds x 2

## 2017-12-19 ENCOUNTER — Ambulatory Visit: Payer: PPO

## 2017-12-23 ENCOUNTER — Ambulatory Visit: Payer: PPO

## 2017-12-23 DIAGNOSIS — R262 Difficulty in walking, not elsewhere classified: Secondary | ICD-10-CM

## 2017-12-23 DIAGNOSIS — M6281 Muscle weakness (generalized): Secondary | ICD-10-CM

## 2017-12-23 DIAGNOSIS — M545 Low back pain, unspecified: Secondary | ICD-10-CM

## 2017-12-23 DIAGNOSIS — G8929 Other chronic pain: Secondary | ICD-10-CM

## 2017-12-23 DIAGNOSIS — M5417 Radiculopathy, lumbosacral region: Secondary | ICD-10-CM

## 2017-12-23 DIAGNOSIS — Z9181 History of falling: Secondary | ICD-10-CM

## 2017-12-23 NOTE — Therapy (Signed)
[facility] [address] [place], [address] Phone: [phone]   Fax:  [phone]  Physical Therapy Treatment  Patient Details  Name: Steven Buckley MRN: [id number] Date of Birth: 10/08/1940 Referring Provider (PT): Allene Dillon, NP   Encounter Date: 12/23/2017  PT End of Session - 12/23/17 0902    Visit Number  3    Number of Visits  13    Date for PT Re-Evaluation  01/23/18    Authorization Type  3    Authorization Time Period  of 10 progress report    PT Start Time  0903    PT Stop Time  0949    PT Time Calculation (min)  46 min    Equipment Utilized During Treatment  Gait belt    Activity Tolerance  Patient tolerated treatment well    Behavior During Therapy  [facility] for tasks assessed/performed       Past Medical History:  Diagnosis Date  . Anxiety   . Cancer [facility]) 2007   prostate, treated with seeds. skin cancer also  . Dizziness 04/2017   looses balance frequently but no falls  . GERD (gastroesophageal reflux disease)   . Glaucoma    bilateral  . History of kidney stones   . Macular degeneration   . Orthostatic hypotension   . Parkinson's disease [facility])     Past Surgical History:  Procedure Laterality Date  . EYE SURGERY Bilateral    cataract extractions  . HAND SURGERY Right    crushed hand in hay baler  . HERNIA REPAIR Left 1949   inguinal repair  . INGUINAL HERNIA REPAIR Right 05/22/2017   Procedure: HERNIA REPAIR INGUINAL ADULT;  Surgeon: Herbert Pun, MD;  Location: [facility] ORS;  Service: General;  Laterality: Right;  . SHOULDER ARTHROSCOPY Right 1999   rotator cuff repair  . skin cancer removal  2018   squamos cell on nose, basal cell on cheek, back  . TONSILLECTOMY      There were no vitals filed for this visit.  Subjective Assessment - 12/23/17 0904    Subjective  Back and R foot are so far so good. Back bothered him yesterday. R foot did pretty good  yesterday.     Pertinent History  Pt states that he is not sure what PT is going to do.  Lumbar radiculitis. Pt states that he has drop foot on R. Unable to DF his R foot. His ER visit was 5-6 weeks ago in which he had a lot of pain in his R lateral leg (L5 dermatome).  Had a shot R hip outpatient from Dr. Roland Rack which did not help until 4-5 days after. Pain disappeared. Pt was also taking prednisone.  Got an MRI and was referred to Dr. Sharlet Salina. Unable to see Dr. Sharlet Salina.  His nurse mentioned that he has arthritis. His drop foot bothers him the most. Feels like he is flopping his R foot out to the side.  Uses a SPC sporadically (large base). Also has a rollator.  Has had drop foot starting August 2019.  Back also hurts all the time.  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    Pt also states that he has Parkinson's. Also lost 60 to 70 lbs for about 2 years. [facility] [employer] [facility]  Pt however gradually got weaker.  Dr. Sabra Heck, Dr. Manuella Ghazi knows about the  loss in weight and the increased weakness.    Lethargy, weakness, lack of stamina bothers him.  Planning to move into a retirement community.     Last time Pt felt R lateral hip and thigh pain was about 2-3 weeks ago. Last time pt took prednisone medicaiton was about 3-4 weeks ago.     Patient Stated Goals  Be able to walk better.     Currently in Pain?  No/denies    Pain Score  0-No pain    Pain Onset  More than a month ago                               PT Education - 12/23/17 0935    Education Details  ther-ex    Person(s) Educated  Patient    Methods  Explanation;Demonstration;Tactile cues;Verbal cues    Comprehension  Returned demonstration;Verbalized understanding      Objective    Medbridge  Access Code: [email or web address]    Denies ostoeporosis. No latex allergies    Manual therapy  Seated A to P to R ankle grade 3 to promote ankle DF ROM  Sitting with R ankle in  DF (heel slide position)              STM to soleus muscle   Therapeutic exercise  Seated B shoulder extension isometrics 10x2 with 5 second holds  Seated transversus abdominis contraction 10x 5 seconds for 2 sets   Lat pull downs at Omega machine 3x plate 15. Low back discomfort. Slowly eases with rest   Seated thoracic extension isometrics against chair back 10x2 with 5 second holds  Felt good for back  Standing straight pallof press resisting double yellow band 10x2 with 5 second holds    Seated physioball rolls  Flexion 10x5 seconds for 2 sets  Standing B shoulder extension resisting yellow band 10x2  Pt states being dizzy. Room not spinning.  Blood pressure L arm sitting 107/80, HR 69  Pt states dizziness better when sitting   Seated ankle pumps 15x   Improved exercise technique, movement at target joints, use of target muscles after min to mod verbal, visual, tactile cues.    Continued working on gentle trunk flexion, thoracic extension, trunk muslce activation to help decrease low back extension pressure. Continued working on improving ankle joint mobility and decreasing soleus muscle tension to help promote ankle DF. Improving ankle DF AROM observed during gait. Pt will benefit from continued skilled physical therapy services to decrease back pain, improve strength and improve R foot clearance during gait.      PT Short Term Goals - 12/12/17 1327      PT SHORT TERM GOAL #1   Title  Pt will be independent with his HEP to decrease back pain, improve LE strength, decrease fall risk.     Time  3    Period  Weeks    Status  New    Target Date  01/02/18        PT Long Term Goals - 12/12/17 1328      PT LONG TERM GOAL #1   Title  Patient will have a decrease in back pain to 5/10 or less at worst to promote ability to ambulate, perform standing tasks more comfortably.     Baseline  9/10 back pain at worst for the past 3 months (12/12/2017)    Time  6     Period  Weeks  Status  New    Target Date  01/23/18      PT LONG TERM GOAL #2   Title  Pt will improve R ankle DF AROM to 0 degrees AROM or more to promote foot clearance during gait and decrease fall risk.     Baseline  R ankle DF AROM: -13 degrees (12/12/2017)    Time  6    Period  Weeks    Status  New    Target Date  01/23/18      PT LONG TERM GOAL #3   Title  Pt will improve bilateral glute med and max strength by at least 1/2 MMT grade to promote ability to ambulate, perform standing tasks more comfortably for his back.     Baseline  hip extension 4+/5 bilaterally, hip abduction 4-/5 bilaterally. 12/12/2017    Time  6    Period  Weeks    Status  New    Target Date  01/23/18      PT LONG TERM GOAL #4   Title  Pt will improve his lumbar spine FOTO by at least 10 points as a demonstration of improved function.     Baseline  lumbar spine FOTO 29 (12/12/2017)     Time  6    Period  Weeks    Status  New    Target Date  01/23/18            Plan - 12/23/17 0936    Clinical Impression Statement  Continued working on gentle trunk flexion, thoracic extension, trunk muslce activation to help decrease low back extension pressure. Continued working on improving ankle joint mobility and decreasing soleus muscle tension to help promote ankle DF. Improving ankle DF AROM observed during gait. Pt will benefit from continued skilled physical therapy services to decrease back pain, improve strength and improve R foot clearance during gait.     Rehab Potential  Fair    Clinical Impairments Affecting Rehab Potential  (-) Chronicity of back pain, Parkinson's. (+) good wife support.     PT Frequency  2x / week    PT Duration  6 weeks    PT Treatment/Interventions  Therapeutic activities;Therapeutic exercise;Balance training;Neuromuscular re-education;Patient/family education;Manual techniques;Dry needling;Aquatic Therapy;Electrical Stimulation;Iontophoresis 4mg /ml Dexamethasone;Gait  training    PT Next Visit Plan  thoracic extension, scapular strengthing, glute med and max strengthening, ankle mobility, DF stretches, balance, manual techniques, modalities PRN    Consulted and Agree with Plan of Care  Patient;Family member/caregiver    Family Member Consulted  wife       Patient will benefit from skilled therapeutic intervention in order to improve the following deficits and impairments:  Pain, Improper body mechanics, Postural dysfunction, Decreased strength, Difficulty walking, Decreased range of motion, Decreased balance  Visit Diagnosis: Chronic bilateral low back pain, unspecified whether sciatica present  Radiculopathy, lumbosacral region  Muscle weakness (generalized)  History of falling  Difficulty in walking, not elsewhere classified     Problem List There are no active problems to display for this patient.   Joneen Boers PT, DPT   12/23/2017, 10:57 AM  [facility] [address] [place], [address] Phone: [phone]   Fax:  [phone]  Name: LOVETT COFFIN III MRN: [id number] Date of Birth: 1940/03/22

## 2017-12-25 ENCOUNTER — Ambulatory Visit: Payer: PPO

## 2017-12-25 DIAGNOSIS — M6281 Muscle weakness (generalized): Secondary | ICD-10-CM

## 2017-12-25 DIAGNOSIS — M545 Low back pain: Principal | ICD-10-CM

## 2017-12-25 DIAGNOSIS — Z9181 History of falling: Secondary | ICD-10-CM

## 2017-12-25 DIAGNOSIS — G8929 Other chronic pain: Secondary | ICD-10-CM

## 2017-12-25 DIAGNOSIS — R262 Difficulty in walking, not elsewhere classified: Secondary | ICD-10-CM

## 2017-12-25 DIAGNOSIS — M5417 Radiculopathy, lumbosacral region: Secondary | ICD-10-CM

## 2017-12-25 NOTE — Therapy (Signed)
[facility] [address], [place], [address] Phone: [phone]   Fax:  [phone]  Physical Therapy Treatment  Patient Details  Name: Steven Buckley MRN: [id number] Date of Birth: 10/26/1940 Referring Provider (PT): Allene Dillon, NP   Encounter Date: 12/25/2017  PT End of Session - 12/25/17 1600    Visit Number  4    Number of Visits  13    Date for PT Re-Evaluation  01/23/18    Authorization Type  4    Authorization Time Period  of 10 progress report    PT Start Time  1521    PT Stop Time  1604    PT Time Calculation (min)  43 min    Equipment Utilized During Treatment  Gait belt    Activity Tolerance  Patient tolerated treatment well    Behavior During Therapy  WFL for tasks assessed/performed       Past Medical History:  Diagnosis Date  . Anxiety   . Cancer [facility]) 2007   prostate, treated with seeds. skin cancer also  . Dizziness 04/2017   looses balance frequently but no falls  . GERD (gastroesophageal reflux disease)   . Glaucoma    bilateral  . History of kidney stones   . Macular degeneration   . Orthostatic hypotension   . Parkinson's disease [facility])     Past Surgical History:  Procedure Laterality Date  . EYE SURGERY Bilateral    cataract extractions  . HAND SURGERY Right    crushed hand in hay baler  . HERNIA REPAIR Left 1949   inguinal repair  . INGUINAL HERNIA REPAIR Right 05/22/2017   Procedure: HERNIA REPAIR INGUINAL ADULT;  Surgeon: Herbert Pun, MD;  Location: [facility] ORS;  Service: General;  Laterality: Right;  . SHOULDER ARTHROSCOPY Right 1999   rotator cuff repair  . skin cancer removal  2018   squamos cell on nose, basal cell on cheek, back  . TONSILLECTOMY      There were no vitals filed for this visit.  Subjective Assessment - 12/25/17 1523    Subjective  No back pain. Unable to discern any difference with his back and R foot.     Pertinent History   Pt states that he is not sure what PT is going to do.  Lumbar radiculitis. Pt states that he has drop foot on R. Unable to DF his R foot. His ER visit was 5-6 weeks ago in which he had a lot of pain in his R lateral leg (L5 dermatome).  Had a shot R hip outpatient from Dr. Roland Rack which did not help until 4-5 days after. Pain disappeared. Pt was also taking prednisone.  Got an MRI and was referred to Dr. Sharlet Salina. Unable to see Dr. Sharlet Salina.  His nurse mentioned that he has arthritis. His drop foot bothers him the most. Feels like he is flopping his R foot out to the side.  Uses a SPC sporadically (large base). Also has a rollator.  Has had drop foot starting August 2019.  Back also hurts all the time.  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    Pt also states that he has Parkinson's. Also lost 60 to 70 lbs for about 2 years. [facility] [employer] [facility]  Pt however gradually got weaker.  Dr. Sabra Heck, Dr. Manuella Ghazi knows about the loss in weight and the  increased weakness.    Lethargy, weakness, lack of stamina bothers him.  Planning to move into a retirement community.     Last time Pt felt R lateral hip and thigh pain was about 2-3 weeks ago. Last time pt took prednisone medicaiton was about 3-4 weeks ago.     Patient Stated Goals  Be able to walk better.     Currently in Pain?  No/denies    Pain Onset  More than a month ago                               PT Education - 12/25/17 1653    Education Details  ther-ex    Person(s) Educated  Patient    Methods  Explanation;Demonstration;Tactile cues;Verbal cues    Comprehension  Returned demonstration;Verbalized understanding        Objective    MedbridgeAccess Code: [email or web address]   Denies ostoeporosis.No latex allergies    Therapeutic exercise  Seated PT assisted R ankle DF to end range 3x5 with 5 second holds   Seated STM to R soleus prior to ankle DF ROM on rocker  board  (CGA secondary to balance) Standing ankle DF/PF on rockerboard 80min 45 seconds to promote ankle DF ROM  Standing B shoulder extension resisitng red band 10x5 seconds for 2 sets to promote trunk muscle strengthening   Session paused for pt bathroom break 5 min   Standing B scap retract green band 10x2 with 5 second holds to promote thoracic extension  Standing leg press resisting double blue band with B UE assist 10x3 R LE  Low back discomfort  Seated trunk flexion, resting forearms on thighs. Decreased low back discomfort.     Improved exercise technique, movement at target joints, use of target muscles after min to mod verbal, visual, tactile cues.   Continued working on thoracic extension and trunk strengthening to help decrease extension pressure to low back. Worked on glute strengthening with standing leg press exercises. Low back discomfort afterwards which eased with seated trunk flexion position.       PT Short Term Goals - 12/12/17 1327      PT SHORT TERM GOAL #1   Title  Pt will be independent with his HEP to decrease back pain, improve LE strength, decrease fall risk.     Time  3    Period  Weeks    Status  New    Target Date  01/02/18        PT Long Term Goals - 12/12/17 1328      PT LONG TERM GOAL #1   Title  Patient will have a decrease in back pain to 5/10 or less at worst to promote ability to ambulate, perform standing tasks more comfortably.     Baseline  9/10 back pain at worst for the past 3 months (12/12/2017)    Time  6    Period  Weeks    Status  New    Target Date  01/23/18      PT LONG TERM GOAL #2   Title  Pt will improve R ankle DF AROM to 0 degrees AROM or more to promote foot clearance during gait and decrease fall risk.     Baseline  R ankle DF AROM: -13 degrees (12/12/2017)    Time  6    Period  Weeks    Status  New    Target Date  01/23/18  PT LONG TERM GOAL #3   Title  Pt will improve bilateral glute med and max  strength by at least 1/2 MMT grade to promote ability to ambulate, perform standing tasks more comfortably for his back.     Baseline  hip extension 4+/5 bilaterally, hip abduction 4-/5 bilaterally. 12/12/2017    Time  6    Period  Weeks    Status  New    Target Date  01/23/18      PT LONG TERM GOAL #4   Title  Pt will improve his lumbar spine FOTO by at least 10 points as a demonstration of improved function.     Baseline  lumbar spine FOTO 29 (12/12/2017)     Time  6    Period  Weeks    Status  New    Target Date  01/23/18            Plan - 12/25/17 1654    Clinical Impression Statement  Continued working on thoracic extension and trunk strengthening to help decrease extension pressure to low back. Worked on glute strengthening with standing leg press exercises. Low back discomfort afterwards which eased with seated trunk flexion position.     Rehab Potential  Fair    Clinical Impairments Affecting Rehab Potential  (-) Chronicity of back pain, Parkinson's. (+) good wife support.     PT Frequency  2x / week    PT Duration  6 weeks    PT Treatment/Interventions  Therapeutic activities;Therapeutic exercise;Balance training;Neuromuscular re-education;Patient/family education;Manual techniques;Dry needling;Aquatic Therapy;Electrical Stimulation;Iontophoresis 4mg /ml Dexamethasone;Gait training    PT Next Visit Plan  thoracic extension, scapular strengthing, glute med and max strengthening, ankle mobility, DF stretches, balance, manual techniques, modalities PRN    Consulted and Agree with Plan of Care  Patient;Family member/caregiver    Family Member Consulted  wife       Patient will benefit from skilled therapeutic intervention in order to improve the following deficits and impairments:  Pain, Improper body mechanics, Postural dysfunction, Decreased strength, Difficulty walking, Decreased range of motion, Decreased balance  Visit Diagnosis: Chronic bilateral low back pain,  unspecified whether sciatica present  Radiculopathy, lumbosacral region  Muscle weakness (generalized)  History of falling  Difficulty in walking, not elsewhere classified     Problem List There are no active problems to display for this patient.   Joneen Boers PT, DPT   12/25/2017, 5:03 PM  [facility] [address], [place], [address] Phone: [phone]   Fax:  [phone]  Name: Steven Buckley MRN: [id number] Date of Birth: 1940/06/24

## 2017-12-30 ENCOUNTER — Ambulatory Visit: Payer: PPO | Attending: Family Medicine

## 2017-12-30 DIAGNOSIS — M6281 Muscle weakness (generalized): Secondary | ICD-10-CM | POA: Diagnosis not present

## 2017-12-30 DIAGNOSIS — R262 Difficulty in walking, not elsewhere classified: Secondary | ICD-10-CM | POA: Diagnosis not present

## 2017-12-30 DIAGNOSIS — M5417 Radiculopathy, lumbosacral region: Secondary | ICD-10-CM | POA: Diagnosis not present

## 2017-12-30 DIAGNOSIS — M545 Low back pain: Secondary | ICD-10-CM | POA: Insufficient documentation

## 2017-12-30 DIAGNOSIS — G8929 Other chronic pain: Secondary | ICD-10-CM | POA: Diagnosis not present

## 2017-12-30 DIAGNOSIS — Z9181 History of falling: Secondary | ICD-10-CM | POA: Diagnosis not present

## 2017-12-30 NOTE — Therapy (Signed)
[place] [facility] [address], [place], [address] Phone: [phone]   Fax:  [phone]  Physical Therapy Treatment  Patient Details  Name: Steven Buckley MRN: [id number] Date of Birth: [birth year] Referring Provider (PT): Allene Dillon, NP   Encounter Date: 12/30/2017  PT End of Session - 12/30/17 1352    Visit Number  5    Number of Visits  13    Date for PT Re-Evaluation  01/23/18    Authorization Type  5    Authorization Time Period  of 10 progress report    PT Start Time  1352    PT Stop Time  1435    PT Time Calculation (min)  43 min    Equipment Utilized During Treatment  Gait belt    Activity Tolerance  Patient tolerated treatment well    Behavior During Therapy  [facility] for tasks assessed/performed       Past Medical History:  Diagnosis Date  . Anxiety   . Cancer [facility]) 2007   prostate, treated with seeds. skin cancer also  . Dizziness 04/2017   looses balance frequently but no falls  . GERD (gastroesophageal reflux disease)   . Glaucoma    bilateral  . History of kidney stones   . Macular degeneration   . Orthostatic hypotension   . Parkinson's disease [facility])     Past Surgical History:  Procedure Laterality Date  . EYE SURGERY Bilateral    cataract extractions  . HAND SURGERY Right    crushed hand in hay baler  . HERNIA REPAIR Left 1949   inguinal repair  . INGUINAL HERNIA REPAIR Right 05/22/2017   Procedure: HERNIA REPAIR INGUINAL ADULT;  Surgeon: Herbert Pun, MD;  Location: [facility] ORS;  Service: General;  Laterality: Right;  . SHOULDER ARTHROSCOPY Right 1999   rotator cuff repair  . skin cancer removal  2018   squamos cell on nose, basal cell on cheek, back  . TONSILLECTOMY      There were no vitals filed for this visit.  Subjective Assessment - 12/30/17 1353    Subjective  Doing pretty good. No back or LE pain currently. No problems with LE radiation during  Thanksgiving. Back still bothers him when he stands and does dishes.   4-5/10 back pain at most for the past 7 days. Usually sits down when it gets to that level then it goes away. Has been doing that since before therapy.  Back usually bothers him about a couple of minues in standing.      Pertinent History  Pt states that he is not sure what PT is going to do.  Lumbar radiculitis. Pt states that he has drop foot on R. Unable to DF his R foot. His ER visit was 5-6 weeks ago in which he had a lot of pain in his R lateral leg (L5 dermatome).  Had a shot R hip outpatient from Dr. Roland Rack which did not help until 4-5 days after. Pain disappeared. Pt was also taking prednisone.  Got an MRI and was referred to Dr. Sharlet Salina. Unable to see Dr. Sharlet Salina.  His nurse mentioned that he has arthritis. His drop foot bothers him the most. Feels like he is flopping his R foot out to the side.  Uses a SPC sporadically (large base). Also has a rollator.  Has had drop foot starting August 2019.  Back also hurts all the time.  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    Pt also states that he has Parkinson's. Also lost 60 to 70 lbs for about 2 years. [facility] [employer] [facility]  Pt however gradually got weaker.  Dr. Sabra Heck, Dr. Manuella Ghazi knows about the loss in weight and the increased weakness.    Lethargy, weakness, lack of stamina bothers him.  Planning to move into a retirement community.     Last time Pt felt R lateral hip and thigh pain was about 2-3 weeks ago. Last time pt took prednisone medicaiton was about 3-4 weeks ago.     Patient Stated Goals  Be able to walk better.     Currently in Pain?  No/denies    Pain Score  0-No pain    Pain Onset  More than a month ago                               PT Education - 12/30/17 1910    Education Details  ther-ex, HEP    Person(s) Educated  Patient    Methods  Explanation;Demonstration;Tactile cues;Verbal  cues;Handout    Comprehension  Returned demonstration;Verbalized understanding        Objective    MedbridgeAccess Code: [email or web address]   Denies ostoeporosis.No latex allergies    Therapeutic exercise  Seated physioball rolls  Flexion 10x5 seconds   Standing leg press resisting double blue band with B UE assist 10x3 R LE pain free range  Then for the L 10x3 pain free range  Standing straight pallof press resisting double yellow band 10x5 seconds for 2 sets  Able to stand for about 4 minutes prior to back cramping up  Symptoms ease with sitting.   Side stepping 5 ft to the L and 5 ft to the R 3x with yellow band around thighs and B UE assist   Seated thoracic extension over chair 10x5 seconds for 3 sets    Reviewed and given as part of his HEP   Supine R ankle DF: -16 degrees AROM, -5 degrees AAROM  Supine R ankle DF AAROM with PT assist 5x3 with 5 second holds    Improved exercise technique, movement at target joints, use of target muscles after min to mod verbal, visual, tactile cues.   Increased back pain with prolonged standing. Pt able to stand for at least 4 minutes performing exercises prior to back bothering him today. Continued working on thoracic extension, hip strengthening and trunk strengthening to help decrease pressure to low back. Decreased low back pain level at worst overall based on subjective reports. Pt will benefit from continued skilled physical therapy services to decrease back pain, improve strength and function.      PT Short Term Goals - 12/12/17 1327      PT SHORT TERM GOAL #1   Title  Pt will be independent with his HEP to decrease back pain, improve LE strength, decrease fall risk.     Time  3    Period  Weeks    Status  New    Target Date  01/02/18        PT Long Term Goals - 12/12/17 1328      PT LONG TERM GOAL #1   Title  Patient will have a decrease in back pain to 5/10 or Steven at worst to promote ability to ambulate,  perform standing tasks more comfortably.  Baseline  9/10 back pain at worst for the past 3 months (12/12/2017)    Time  6    Period  Weeks    Status  New    Target Date  01/23/18      PT LONG TERM GOAL #2   Title  Pt will improve R ankle DF AROM to 0 degrees AROM or more to promote foot clearance during gait and decrease fall risk.     Baseline  R ankle DF AROM: -13 degrees (12/12/2017)    Time  6    Period  Weeks    Status  New    Target Date  01/23/18      PT LONG TERM GOAL #3   Title  Pt will improve bilateral glute med and max strength by at least 1/2 MMT grade to promote ability to ambulate, perform standing tasks more comfortably for his back.     Baseline  hip extension 4+/5 bilaterally, hip abduction 4-/5 bilaterally. 12/12/2017    Time  6    Period  Weeks    Status  New    Target Date  01/23/18      PT LONG TERM GOAL #4   Title  Pt will improve his lumbar spine FOTO by at least 10 points as a demonstration of improved function.     Baseline  lumbar spine FOTO 29 (12/12/2017)     Time  6    Period  Weeks    Status  New    Target Date  01/23/18            Plan - 12/30/17 1910    Clinical Impression Statement  Increased back pain with prolonged standing. Pt able to stand for at least 4 minutes performing exercises prior to back bothering him today. Continued working on thoracic extension, hip strengthening and trunk strengthening to help decrease pressure to low back. Decreased low back pain level at worst overall based on subjective reports. Pt will benefit from continued skilled physical therapy services to decrease back pain, improve strength and function.     Rehab Potential  Fair    Clinical Impairments Affecting Rehab Potential  (-) Chronicity of back pain, Parkinson's. (+) good wife support.     PT Frequency  2x / week    PT Duration  6 weeks    PT Treatment/Interventions  Therapeutic activities;Therapeutic exercise;Balance training;Neuromuscular  re-education;Patient/family education;Manual techniques;Dry needling;Aquatic Therapy;Electrical Stimulation;Iontophoresis 4mg /ml Dexamethasone;Gait training    PT Next Visit Plan  thoracic extension, scapular strengthing, glute med and max strengthening, ankle mobility, DF stretches, balance, manual techniques, modalities PRN    Consulted and Agree with Plan of Care  Patient;Family member/caregiver    Family Member Consulted  wife       Patient will benefit from skilled therapeutic intervention in order to improve the following deficits and impairments:  Pain, Improper body mechanics, Postural dysfunction, Decreased strength, Difficulty walking, Decreased range of motion, Decreased balance  Visit Diagnosis: Chronic bilateral low back pain, unspecified whether sciatica present  Radiculopathy, lumbosacral region  Muscle weakness (generalized)  History of falling  Difficulty in walking, not elsewhere classified     Problem List There are no active problems to display for this patient.   Joneen Boers PT, DPT   12/30/2017, 7:14 PM  [place] [facility] [address], [place], [address] Phone: [phone]   Fax:  [phone]  Name: Steven Buckley MRN: [id number] Date of Birth:  07/17/1940   

## 2017-12-30 NOTE — Patient Instructions (Signed)
MedbridgeAccess Code: [email or web address]  Seated Thoracic Lumbar Extension  10x3 with 5 seconds

## 2018-01-01 ENCOUNTER — Ambulatory Visit: Payer: PPO

## 2018-01-01 DIAGNOSIS — R262 Difficulty in walking, not elsewhere classified: Secondary | ICD-10-CM

## 2018-01-01 DIAGNOSIS — M5417 Radiculopathy, lumbosacral region: Secondary | ICD-10-CM

## 2018-01-01 DIAGNOSIS — M6281 Muscle weakness (generalized): Secondary | ICD-10-CM

## 2018-01-01 DIAGNOSIS — Z9181 History of falling: Secondary | ICD-10-CM

## 2018-01-01 DIAGNOSIS — M545 Low back pain: Secondary | ICD-10-CM | POA: Diagnosis not present

## 2018-01-01 DIAGNOSIS — G8929 Other chronic pain: Secondary | ICD-10-CM

## 2018-01-01 NOTE — Therapy (Signed)
[place] [facility] [address], [place], [address] Phone: [phone]   Fax:  [phone]  Physical Therapy Treatment  Patient Details  Name: Steven Buckley MRN: [id number] Date of Birth: 1940/11/13 Referring Provider (PT): Allene Dillon, NP   Encounter Date: 01/01/2018  PT End of Session - 01/01/18 1341    Visit Number  6    Number of Visits  13    Date for PT Re-Evaluation  01/23/18    Authorization Type  6    Authorization Time Period  of 10 progress report    PT Start Time  1341    PT Stop Time  1426    PT Time Calculation (min)  45 min    Equipment Utilized During Treatment  Gait belt    Activity Tolerance  Patient tolerated treatment well    Behavior During Therapy  [facility] for tasks assessed/performed       Past Medical History:  Diagnosis Date  . Anxiety   . Cancer [facility]) 2007   prostate, treated with seeds. skin cancer also  . Dizziness 04/2017   looses balance frequently but no falls  . GERD (gastroesophageal reflux disease)   . Glaucoma    bilateral  . History of kidney stones   . Macular degeneration   . Orthostatic hypotension   . Parkinson's disease [facility])     Past Surgical History:  Procedure Laterality Date  . EYE SURGERY Bilateral    cataract extractions  . HAND SURGERY Right    crushed hand in hay baler  . HERNIA REPAIR Left 1949   inguinal repair  . INGUINAL HERNIA REPAIR Right 05/22/2017   Procedure: HERNIA REPAIR INGUINAL ADULT;  Surgeon: Herbert Pun, MD;  Location: [facility] ORS;  Service: General;  Laterality: Right;  . SHOULDER ARTHROSCOPY Right 1999   rotator cuff repair  . skin cancer removal  2018   squamos cell on nose, basal cell on cheek, back  . TONSILLECTOMY      There were no vitals filed for this visit.  Subjective Assessment - 01/01/18 1342    Subjective  Doing good. Back not bothering him too much. Tries not to do things that bother him. Does not  have any energy. Ate lunch (Chunky soup). Had a chocolate chip muffin for breakfast.      Pertinent History  Pt states that he is not sure what PT is going to do.  Lumbar radiculitis. Pt states that he has drop foot on R. Unable to DF his R foot. His ER visit was 5-6 weeks ago in which he had a lot of pain in his R lateral leg (L5 dermatome).  Had a shot R hip outpatient from Dr. Roland Rack which did not help until 4-5 days after. Pain disappeared. Pt was also taking prednisone.  Got an MRI and was referred to Dr. Sharlet Salina. Unable to see Dr. Sharlet Salina.  His nurse mentioned that he has arthritis. His drop foot bothers him the most. Feels like he is flopping his R foot out to the side.  Uses a SPC sporadically (large base). Also has a rollator.  Has had drop foot starting August 2019.  Back also hurts all the time.  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    Pt also states that he has Parkinson's. Also lost 60 to 70 lbs for about 2 years. [facility] [employer] [facility].  Pt however gradually got weaker.  Dr. Sabra Heck, Dr. Manuella Ghazi knows about the loss in weight and the increased weakness.    Lethargy, weakness, lack of stamina bothers him.  Planning to move into a retirement community.     Last time Pt felt R lateral hip and thigh pain was about 2-3 weeks ago. Last time pt took prednisone medicaiton was about 3-4 weeks ago.     Patient Stated Goals  Be able to walk better.     Currently in Pain?  Other (Comment)   no pain level provided.    Pain Onset  More than a month ago                               PT Education - 01/01/18 1610    Education Details  ther-ex    [employer]) Educated  Patient    Methods  Explanation;Demonstration;Tactile cues;Verbal cues    Comprehension  Returned demonstration;Verbalized understanding        Objective    MedbridgeAccess Code: [email or web address]   Denies ostoeporosis.No latex allergies   No TTP B  piriformis  Therapeutic exercise  Slump: (-) L LE, (+) R LE with symptoms at Achilles tendon area.   Sitting with lumbar towel roll. Discomfort  R ankle DF AROM -16 degrees  Supine R hip IR in 90/90: limited ROM  Supine R hip IR stretch in 90/90 10x2, then 10x5 seconds  Seated R LE neural flossing 10x2  -15 degrees R ankle DF afterwards  Seated ankle heel/toe raises 10x  Then using rocker board in sitting for R ankle 1 min   Improved exercise technique, movement at target joints, use of target muscles after mod verbal, visual, tactile cues.     Manual therapy   L S/L STM R piriformis muslce. -14 degrees supine R ankle DF AROM    Pt demonstrates R LE neural tension with symptoms along his R distal leg. Focused on decreasing R piriformis muscle tension, improving R hip IR movement, and neural flossing motion today to help promote better movement along the R sciatic nerve and hopefully therefore better muscle activation at the peroneal nerve area to help with tibialis muscle activation. Minimal improvement with pt able to achieve -14 degrees ankle DF AROM. L low back pain reproduced with extension positions.    PT Short Term Goals - 12/12/17 1327      PT SHORT TERM GOAL #1   Title  Pt will be independent with his HEP to decrease back pain, improve LE strength, decrease fall risk.     Time  3    Period  Weeks    Status  New    Target Date  01/02/18        PT Long Term Goals - 12/12/17 1328      PT LONG TERM GOAL #1   Title  Patient will have a decrease in back pain to 5/10 or less at worst to promote ability to ambulate, perform standing tasks more comfortably.     Baseline  9/10 back pain at worst for the past 3 months (12/12/2017)    Time  6    Period  Weeks    Status  New    Target Date  01/23/18      PT LONG TERM GOAL #2   Title  Pt will improve R ankle DF AROM to 0 degrees AROM or more to promote foot clearance during gait and  decrease fall risk.      Baseline  R ankle DF AROM: -13 degrees (12/12/2017)    Time  6    Period  Weeks    Status  New    Target Date  01/23/18      PT LONG TERM GOAL #3   Title  Pt will improve bilateral glute med and max strength by at least 1/2 MMT grade to promote ability to ambulate, perform standing tasks more comfortably for his back.     Baseline  hip extension 4+/5 bilaterally, hip abduction 4-/5 bilaterally. 12/12/2017    Time  6    Period  Weeks    Status  New    Target Date  01/23/18      PT LONG TERM GOAL #4   Title  Pt will improve his lumbar spine FOTO by at least 10 points as a demonstration of improved function.     Baseline  lumbar spine FOTO 29 (12/12/2017)     Time  6    Period  Weeks    Status  New    Target Date  01/23/18            Plan - 01/01/18 1332    Clinical Impression Statement  Pt demonstrates R LE neural tension with symptoms along his R distal leg. Focused on decreasing R piriformis muscle tension, improving R hip IR movement, and neural flossing motion today to help promote better movement along the R sciatic nerve and hopefully therefore better muscle activation at the peroneal nerve area to help with tibialis muscle activation. Minimal improvement with pt able to achieve -14 degrees ankle DF AROM. L low back pain reproduced with extension positions.     Rehab Potential  Fair    Clinical Impairments Affecting Rehab Potential  (-) Chronicity of back pain, Parkinson's. (+) good wife support.     PT Frequency  2x / week    PT Duration  6 weeks    PT Treatment/Interventions  Therapeutic activities;Therapeutic exercise;Balance training;Neuromuscular re-education;Patient/family education;Manual techniques;Dry needling;Aquatic Therapy;Electrical Stimulation;Iontophoresis 4mg /ml Dexamethasone;Gait training    PT Next Visit Plan  thoracic extension, scapular strengthing, glute med and max strengthening, ankle mobility, DF stretches, balance, manual techniques, modalities PRN     Consulted and Agree with Plan of Care  Patient;Family member/caregiver    Family Member Consulted  wife       Patient will benefit from skilled therapeutic intervention in order to improve the following deficits and impairments:  Pain, Improper body mechanics, Postural dysfunction, Decreased strength, Difficulty walking, Decreased range of motion, Decreased balance  Visit Diagnosis: Chronic bilateral low back pain, unspecified whether sciatica present  Radiculopathy, lumbosacral region  Muscle weakness (generalized)  History of falling  Difficulty in walking, not elsewhere classified     Problem List There are no active problems to display for this patient.   Joneen Boers PT, DPT   01/01/2018, 4:15 PM  [place] [facility] [address] [place], [address] Phone: [phone]   Fax:  [phone]  Name: Steven Buckley MRN: [id number] Date of Birth: 11/26/1940

## 2018-01-06 ENCOUNTER — Ambulatory Visit: Payer: PPO

## 2018-01-06 DIAGNOSIS — R262 Difficulty in walking, not elsewhere classified: Secondary | ICD-10-CM

## 2018-01-06 DIAGNOSIS — M6281 Muscle weakness (generalized): Secondary | ICD-10-CM

## 2018-01-06 DIAGNOSIS — Z9181 History of falling: Secondary | ICD-10-CM

## 2018-01-06 DIAGNOSIS — G8929 Other chronic pain: Secondary | ICD-10-CM

## 2018-01-06 DIAGNOSIS — M545 Low back pain: Principal | ICD-10-CM

## 2018-01-06 DIAGNOSIS — M5417 Radiculopathy, lumbosacral region: Secondary | ICD-10-CM

## 2018-01-06 NOTE — Therapy (Signed)
[facility] [address], [place], [address] Phone: [phone]   Fax:  [phone]  Physical Therapy Treatment  Patient Details  Name: Steven Buckley MRN: [id number] Date of Birth: [birth year] Referring Provider (PT): Allene Dillon, NP   Encounter Date: 01/06/2018  PT End of Session - 01/06/18 1433    Visit Number  7    Number of Visits  13    Date for PT Re-Evaluation  01/23/18    Authorization Type  7    Authorization Time Period  of 10 progress report    PT Start Time  1962    PT Stop Time  1515    PT Time Calculation (min)  42 min    Equipment Utilized During Treatment  Gait belt    Activity Tolerance  Patient tolerated treatment well    Behavior During Therapy  [facility] for tasks assessed/performed       Past Medical History:  Diagnosis Date  . Anxiety   . Cancer [facility]) 2007   prostate, treated with seeds. skin cancer also  . Dizziness 04/2017   looses balance frequently but no falls  . GERD (gastroesophageal reflux disease)   . Glaucoma    bilateral  . History of kidney stones   . Macular degeneration   . Orthostatic hypotension   . Parkinson's disease [facility])     Past Surgical History:  Procedure Laterality Date  . EYE SURGERY Bilateral    cataract extractions  . HAND SURGERY Right    crushed hand in hay baler  . HERNIA REPAIR Left 1949   inguinal repair  . INGUINAL HERNIA REPAIR Right 05/22/2017   Procedure: HERNIA REPAIR INGUINAL ADULT;  Surgeon: Herbert Pun, MD;  Location: [facility] ORS;  Service: General;  Laterality: Right;  . SHOULDER ARTHROSCOPY Right 1999   rotator cuff repair  . skin cancer removal  2018   squamos cell on nose, basal cell on cheek, back  . TONSILLECTOMY      There were no vitals filed for this visit.  Subjective Assessment - 01/06/18 1433    Subjective  Back is about the same. No pain currently. Back did not bother him this weekend. Did not do  standing tasks much like washing dishes. Did a little bit.     Pertinent History  Pt states that he is not sure what PT is going to do.  Lumbar radiculitis. Pt states that he has drop foot on R. Unable to DF his R foot. His ER visit was 5-6 weeks ago in which he had a lot of pain in his R lateral leg (L5 dermatome).  Had a shot R hip outpatient from Dr. Roland Rack which did not help until 4-5 days after. Pain disappeared. Pt was also taking prednisone.  Got an MRI and was referred to Dr. Sharlet Salina. Unable to see Dr. Sharlet Salina.  His nurse mentioned that he has arthritis. His drop foot bothers him the most. Feels like he is flopping his R foot out to the side.  Uses a SPC sporadically (large base). Also has a rollator.  Has had drop foot starting August 2019.  Back also hurts all the time.  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    Pt also states that he has Parkinson's. Also lost 60 to 70 lbs for about 2 years. [facility] [employer] [facility]  Pt however gradually got  weaker.  Dr. Sabra Heck, Dr. Manuella Ghazi knows about the loss in weight and the increased weakness.    Lethargy, weakness, lack of stamina bothers him.  Planning to move into a retirement community.     Last time Pt felt R lateral hip and thigh pain was about 2-3 weeks ago. Last time pt took prednisone medicaiton was about 3-4 weeks ago.     Patient Stated Goals  Be able to walk better.     Currently in Pain?  No/denies    Pain Score  0-No pain    Pain Onset  More than a month ago                               PT Education - 01/06/18 1507    Education Details  ther-ex    Person(s) Educated  Patient    Methods  Explanation;Demonstration;Tactile cues;Verbal cues    Comprehension  Returned demonstration;Verbalized understanding        Objective    MedbridgeAccess Code: [email or web address]   Denies ostoeporosis.No latex allergies    Therapeutic exercise  Side stepping 32 ft to  the R and 32 ft to the L. CGA. Slight low back discomfort   Forward wedding march 2x 32 ft to promote glute med muscle strengthening.   Straight pallof press double yellow band 10x5 seconds   5x10 seconds for 2 sets   Running man R LE with one UE assist 10x2. Not much glute muscle work felt. More in lateral thigh and knee  Standing R LE leg press resisting double blue band with B UE assist 10x3  Stepping over 2 mini hurdles with one UE assist, emphasis on foot clearance  Then without UE assist 10x2 each LE. Able to perform, missed one mini hurdle secondary to not being close enough to step over.    Standing B shoulder extension yellow band with scapular retraction 10x3  Standing static mini squat with B UE assist 5x5 seconds  Able to perform standing exercises without sitting rest break until 3:10 pm (greater than 4 min standing previous session)  Standing hip abduction with B UE assist 2 lbs ankle weight each LE 10x2 each LE  Improved exercise technique, movement at target joints, use of target muscles after min to mod verbal, visual, tactile cues.    Pt better able to tolerate standing activities today compared to previous session, being able to perform standing exercises for over 20 min prior to having to sit down due to his back bothering him. Continued working on thoracic extension and glute strengthening to help continue progress. Also worked on stepping over an obstacle to help decrease fall risk. Pt tolerated session well without aggravation of symptoms. Pt will benefit from continued skilled physical therapy services to decrease pain, improve strength, decrease fall risk.    PT Short Term Goals - 12/12/17 1327      PT SHORT TERM GOAL #1   Title  Pt will be independent with his HEP to decrease back pain, improve LE strength, decrease fall risk.     Time  3    Period  Weeks    Status  New    Target Date  01/02/18        PT Long Term Goals - 12/12/17 1328      PT LONG  TERM GOAL #1   Title  Patient will have a decrease in back pain to 5/10 or less at worst to  promote ability to ambulate, perform standing tasks more comfortably.     Baseline  9/10 back pain at worst for the past 3 months (12/12/2017)    Time  6    Period  Weeks    Status  New    Target Date  01/23/18      PT LONG TERM GOAL #2   Title  Pt will improve R ankle DF AROM to 0 degrees AROM or more to promote foot clearance during gait and decrease fall risk.     Baseline  R ankle DF AROM: -13 degrees (12/12/2017)    Time  6    Period  Weeks    Status  New    Target Date  01/23/18      PT LONG TERM GOAL #3   Title  Pt will improve bilateral glute med and max strength by at least 1/2 MMT grade to promote ability to ambulate, perform standing tasks more comfortably for his back.     Baseline  hip extension 4+/5 bilaterally, hip abduction 4-/5 bilaterally. 12/12/2017    Time  6    Period  Weeks    Status  New    Target Date  01/23/18      PT LONG TERM GOAL #4   Title  Pt will improve his lumbar spine FOTO by at least 10 points as a demonstration of improved function.     Baseline  lumbar spine FOTO 29 (12/12/2017)     Time  6    Period  Weeks    Status  New    Target Date  01/23/18            Plan - 01/06/18 2023    Clinical Impression Statement  Pt better able to tolerate standing activities today compared to previous session, being able to perform standing exercises for over 20 min prior to having to sit down due to his back bothering him. Continued working on thoracic extension and glute strengthening to help continue progress. Also worked on stepping over an obstacle to help decrease fall risk. Pt tolerated session well without aggravation of symptoms. Pt will benefit from continued skilled physical therapy services to decrease pain, improve strength, decrease fall risk.     Rehab Potential  Fair    Clinical Impairments Affecting Rehab Potential  (-) Chronicity of back pain,  Parkinson's. (+) good wife support.     PT Frequency  2x / week    PT Duration  6 weeks    PT Treatment/Interventions  Therapeutic activities;Therapeutic exercise;Balance training;Neuromuscular re-education;Patient/family education;Manual techniques;Dry needling;Aquatic Therapy;Electrical Stimulation;Iontophoresis 4mg /ml Dexamethasone;Gait training    PT Next Visit Plan  thoracic extension, scapular strengthing, glute med and max strengthening, ankle mobility, DF stretches, balance, manual techniques, modalities PRN    Consulted and Agree with Plan of Care  Patient;Family member/caregiver    Family Member Consulted  wife       Patient will benefit from skilled therapeutic intervention in order to improve the following deficits and impairments:  Pain, Improper body mechanics, Postural dysfunction, Decreased strength, Difficulty walking, Decreased range of motion, Decreased balance  Visit Diagnosis: Chronic bilateral low back pain, unspecified whether sciatica present  Radiculopathy, lumbosacral region  Muscle weakness (generalized)  History of falling  Difficulty in walking, not elsewhere classified     Problem List There are no active problems to display for this patient.   Joneen Boers PT, DPT   01/06/2018, 8:30 PM  [facility] [address] [place], [address] Phone: [phone]   Fax:  [phone]  Name: Steven Buckley MRN: [id number] Date of Birth: December 08, 1940

## 2018-01-08 ENCOUNTER — Ambulatory Visit: Payer: PPO | Admitting: Physical Therapy

## 2018-01-08 DIAGNOSIS — R262 Difficulty in walking, not elsewhere classified: Secondary | ICD-10-CM

## 2018-01-08 DIAGNOSIS — M5417 Radiculopathy, lumbosacral region: Secondary | ICD-10-CM

## 2018-01-08 DIAGNOSIS — M545 Low back pain: Principal | ICD-10-CM

## 2018-01-08 DIAGNOSIS — M6281 Muscle weakness (generalized): Secondary | ICD-10-CM

## 2018-01-08 DIAGNOSIS — G8929 Other chronic pain: Secondary | ICD-10-CM

## 2018-01-08 DIAGNOSIS — Z9181 History of falling: Secondary | ICD-10-CM

## 2018-01-08 NOTE — Therapy (Signed)
[facility] [address], [place], [address] Phone: [phone]   Fax:  [phone]  Physical Therapy Treatment  Patient Details  Name: Steven Buckley MRN: [id number] Date of Birth: [birth year] Referring Provider (PT): Allene Dillon, NP   Encounter Date: 01/08/2018  PT End of Session - 01/08/18 1505    Visit Number  8    Number of Visits  13    Date for PT Re-Evaluation  01/23/18    Authorization Type  8    Authorization Time Period  of 10 progress report    PT Start Time  9379    PT Stop Time  1428    PT Time Calculation (min)  43 min    Equipment Utilized During Treatment  Gait belt    Activity Tolerance  Patient tolerated treatment well    Behavior During Therapy  WFL for tasks assessed/performed       Past Medical History:  Diagnosis Date  . Anxiety   . Cancer [facility]) 2007   prostate, treated with seeds. skin cancer also  . Dizziness 04/2017   looses balance frequently but no falls  . GERD (gastroesophageal reflux disease)   . Glaucoma    bilateral  . History of kidney stones   . Macular degeneration   . Orthostatic hypotension   . Parkinson's disease [facility])     Past Surgical History:  Procedure Laterality Date  . EYE SURGERY Bilateral    cataract extractions  . HAND SURGERY Right    crushed hand in hay baler  . HERNIA REPAIR Left 1949   inguinal repair  . INGUINAL HERNIA REPAIR Right 05/22/2017   Procedure: HERNIA REPAIR INGUINAL ADULT;  Surgeon: Herbert Pun, MD;  Location: [facility] ORS;  Service: General;  Laterality: Right;  . SHOULDER ARTHROSCOPY Right 1999   rotator cuff repair  . skin cancer removal  2018   squamos cell on nose, basal cell on cheek, back  . TONSILLECTOMY      There were no vitals filed for this visit.  Subjective Assessment - 01/08/18 1349    Subjective  Patient is excited about a trip to [facility] this weekend for some Christmas shows. Patient  states that his back is about the same.     Pertinent History  Pt states that he is not sure what PT is going to do.  Lumbar radiculitis. Pt states that he has drop foot on R. Unable to DF his R foot. His ER visit was 5-6 weeks ago in which he had a lot of pain in his R lateral leg (L5 dermatome).  Had a shot R hip outpatient from Dr. Roland Rack which did not help until 4-5 days after. Pain disappeared. Pt was also taking prednisone.  Got an MRI and was referred to Dr. Sharlet Salina. Unable to see Dr. Sharlet Salina.  His nurse mentioned that he has arthritis. His drop foot bothers him the most. Feels like he is flopping his R foot out to the side.  Uses a SPC sporadically (large base). Also has a rollator.  Has had drop foot starting August 2019.  Back also hurts all the time.  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    Pt also states that he has Parkinson's. Also lost 60 to 70 lbs for about 2 years. [facility] [employer] [facility]  Pt however gradually got weaker.  Dr. Sabra Heck,  Dr. Manuella Ghazi knows about the loss in weight and the increased weakness.    Lethargy, weakness, lack of stamina bothers him.  Planning to move into a retirement community.     Last time Pt felt R lateral hip and thigh pain was about 2-3 weeks ago. Last time pt took prednisone medicaiton was about 3-4 weeks ago.     Patient Stated Goals  Be able to walk better.     Currently in Pain?  No/denies    Pain Score  0-No pain    Pain Onset  More than a month ago       Therapeutic exercise   Side stepping 32 ft to the R x2 and 32 ft to the L x2. SBA.   Forward wedding march 3x 32 ft to promote glute med muscle strengthening.    Straight pallof press double yellow band 10x5 seconds x 3 each side  Standing B shoulder extension yellow band with scapular retraction 10x3   Standing static mini squat with B UE assist 10x3 seconds   Standing hip abduction with B UE assist 2 lbs ankle weight each LE 10x2 each  LE     PT Education - 01/08/18 1504    Education Details  exercise technique, prognosis    Person(s) Educated  Patient    Methods  Explanation;Demonstration;Verbal cues    Comprehension  Verbalized understanding;Need further instruction;Returned demonstration       PT Short Term Goals - 12/12/17 1327      PT SHORT TERM GOAL #1   Title  Pt will be independent with his HEP to decrease back pain, improve LE strength, decrease fall risk.     Time  3    Period  Weeks    Status  New    Target Date  01/02/18        PT Long Term Goals - 12/12/17 1328      PT LONG TERM GOAL #1   Title  Patient will have a decrease in back pain to 5/10 or less at worst to promote ability to ambulate, perform standing tasks more comfortably.     Baseline  9/10 back pain at worst for the past 3 months (12/12/2017)    Time  6    Period  Weeks    Status  New    Target Date  01/23/18      PT LONG TERM GOAL #2   Title  Pt will improve R ankle DF AROM to 0 degrees AROM or more to promote foot clearance during gait and decrease fall risk.     Baseline  R ankle DF AROM: -13 degrees (12/12/2017)    Time  6    Period  Weeks    Status  New    Target Date  01/23/18      PT LONG TERM GOAL #3   Title  Pt will improve bilateral glute med and max strength by at least 1/2 MMT grade to promote ability to ambulate, perform standing tasks more comfortably for his back.     Baseline  hip extension 4+/5 bilaterally, hip abduction 4-/5 bilaterally. 12/12/2017    Time  6    Period  Weeks    Status  New    Target Date  01/23/18      PT LONG TERM GOAL #4   Title  Pt will improve his lumbar spine FOTO by at least 10 points as a demonstration of improved function.     Baseline  lumbar  spine FOTO 29 (12/12/2017)     Time  6    Period  Weeks    Status  New    Target Date  01/23/18            Plan - 01/08/18 1507    Clinical Impression Statement  Patient presents to clinic with decreased motivation, but is  amenable to therapy. Patient benefitted from clear education on the purpose of all exercises and demonstrated improved motivation with more information. Patient tolerated all standings exercises with only 3 seated rest breaks of < 2 min in duration. Patient continues to report back pain after prolonged standing, but discomfort decreases with postural strengthening. Patient will continue to benefit from skilled therapeutic intervention in order to manage pain, improve strength, decrease fall risk, and improve overall QOL.    Rehab Potential  Fair    Clinical Impairments Affecting Rehab Potential  (-) Chronicity of back pain, Parkinson's. (+) good wife support.     PT Frequency  2x / week    PT Duration  6 weeks    PT Treatment/Interventions  Therapeutic activities;Therapeutic exercise;Balance training;Neuromuscular re-education;Patient/family education;Manual techniques;Dry needling;Aquatic Therapy;Electrical Stimulation;Iontophoresis 4mg /ml Dexamethasone;Gait training    PT Next Visit Plan  thoracic extension, scapular strengthing, glute med and max strengthening, ankle mobility, DF stretches, balance, manual techniques, modalities PRN    Consulted and Agree with Plan of Care  Patient;Family member/caregiver    Family Member Consulted  wife       Patient will benefit from skilled therapeutic intervention in order to improve the following deficits and impairments:  Pain, Improper body mechanics, Postural dysfunction, Decreased strength, Difficulty walking, Decreased range of motion, Decreased balance  Visit Diagnosis: Chronic bilateral low back pain, unspecified whether sciatica present  Radiculopathy, lumbosacral region  Muscle weakness (generalized)  History of falling  Difficulty in walking, not elsewhere classified     Problem List There are no active problems to display for this patient.   Myles Gip PT, DPT [phone] 01/08/2018, 3:11 PM  [facility] [address], [place], [address] Phone: [phone]   Fax:  [phone]  Name: JAXSEN BERNHART III MRN: [id number] Date of Birth: 1940-11-16

## 2018-01-15 ENCOUNTER — Ambulatory Visit: Payer: PPO

## 2018-01-21 ENCOUNTER — Ambulatory Visit: Payer: PPO

## 2018-02-07 DIAGNOSIS — L578 Other skin changes due to chronic exposure to nonionizing radiation: Secondary | ICD-10-CM | POA: Diagnosis not present

## 2018-02-07 DIAGNOSIS — C44212 Basal cell carcinoma of skin of right ear and external auricular canal: Secondary | ICD-10-CM | POA: Diagnosis not present

## 2018-02-07 DIAGNOSIS — L988 Other specified disorders of the skin and subcutaneous tissue: Secondary | ICD-10-CM | POA: Diagnosis not present

## 2018-02-07 DIAGNOSIS — L814 Other melanin hyperpigmentation: Secondary | ICD-10-CM | POA: Diagnosis not present

## 2018-02-07 DIAGNOSIS — Z85828 Personal history of other malignant neoplasm of skin: Secondary | ICD-10-CM | POA: Diagnosis not present

## 2018-02-25 DIAGNOSIS — H353212 Exudative age-related macular degeneration, right eye, with inactive choroidal neovascularization: Secondary | ICD-10-CM | POA: Diagnosis not present

## 2018-02-25 DIAGNOSIS — H353222 Exudative age-related macular degeneration, left eye, with inactive choroidal neovascularization: Secondary | ICD-10-CM | POA: Diagnosis not present

## 2018-02-27 DIAGNOSIS — Z961 Presence of intraocular lens: Secondary | ICD-10-CM | POA: Diagnosis not present

## 2018-03-13 DIAGNOSIS — H903 Sensorineural hearing loss, bilateral: Secondary | ICD-10-CM | POA: Diagnosis not present

## 2018-03-20 DIAGNOSIS — H903 Sensorineural hearing loss, bilateral: Secondary | ICD-10-CM | POA: Diagnosis not present

## 2018-03-24 DIAGNOSIS — H539 Unspecified visual disturbance: Secondary | ICD-10-CM | POA: Diagnosis not present

## 2018-03-24 DIAGNOSIS — Z736 Limitation of activities due to disability: Secondary | ICD-10-CM | POA: Diagnosis not present

## 2018-03-24 DIAGNOSIS — H3530 Unspecified macular degeneration: Secondary | ICD-10-CM | POA: Diagnosis not present

## 2018-03-24 DIAGNOSIS — H53413 Scotoma involving central area, bilateral: Secondary | ICD-10-CM | POA: Diagnosis not present

## 2018-03-24 DIAGNOSIS — H532 Diplopia: Secondary | ICD-10-CM | POA: Diagnosis not present

## 2018-03-24 DIAGNOSIS — H353230 Exudative age-related macular degeneration, bilateral, stage unspecified: Secondary | ICD-10-CM | POA: Diagnosis not present

## 2018-04-13 DIAGNOSIS — Z1212 Encounter for screening for malignant neoplasm of rectum: Secondary | ICD-10-CM | POA: Diagnosis not present

## 2018-04-13 DIAGNOSIS — Z1211 Encounter for screening for malignant neoplasm of colon: Secondary | ICD-10-CM | POA: Diagnosis not present

## 2018-04-15 DIAGNOSIS — X32XXXA Exposure to sunlight, initial encounter: Secondary | ICD-10-CM | POA: Diagnosis not present

## 2018-04-15 DIAGNOSIS — D225 Melanocytic nevi of trunk: Secondary | ICD-10-CM | POA: Diagnosis not present

## 2018-04-15 DIAGNOSIS — D2262 Melanocytic nevi of left upper limb, including shoulder: Secondary | ICD-10-CM | POA: Diagnosis not present

## 2018-04-15 DIAGNOSIS — D2272 Melanocytic nevi of left lower limb, including hip: Secondary | ICD-10-CM | POA: Diagnosis not present

## 2018-04-15 DIAGNOSIS — D2271 Melanocytic nevi of right lower limb, including hip: Secondary | ICD-10-CM | POA: Diagnosis not present

## 2018-04-15 DIAGNOSIS — S80261A Insect bite (nonvenomous), right knee, initial encounter: Secondary | ICD-10-CM | POA: Diagnosis not present

## 2018-04-15 DIAGNOSIS — S30861A Insect bite (nonvenomous) of abdominal wall, initial encounter: Secondary | ICD-10-CM | POA: Diagnosis not present

## 2018-04-15 DIAGNOSIS — Z85828 Personal history of other malignant neoplasm of skin: Secondary | ICD-10-CM | POA: Diagnosis not present

## 2018-04-15 DIAGNOSIS — D2261 Melanocytic nevi of right upper limb, including shoulder: Secondary | ICD-10-CM | POA: Diagnosis not present

## 2018-04-15 DIAGNOSIS — Z08 Encounter for follow-up examination after completed treatment for malignant neoplasm: Secondary | ICD-10-CM | POA: Diagnosis not present

## 2018-04-15 DIAGNOSIS — L570 Actinic keratosis: Secondary | ICD-10-CM | POA: Diagnosis not present

## 2018-05-12 DIAGNOSIS — M898X9 Other specified disorders of bone, unspecified site: Secondary | ICD-10-CM | POA: Diagnosis not present

## 2018-05-12 DIAGNOSIS — I10 Essential (primary) hypertension: Secondary | ICD-10-CM | POA: Diagnosis not present

## 2018-05-12 DIAGNOSIS — K219 Gastro-esophageal reflux disease without esophagitis: Secondary | ICD-10-CM | POA: Diagnosis not present

## 2018-05-12 DIAGNOSIS — E7800 Pure hypercholesterolemia, unspecified: Secondary | ICD-10-CM | POA: Diagnosis not present

## 2018-05-12 DIAGNOSIS — Z79899 Other long term (current) drug therapy: Secondary | ICD-10-CM | POA: Diagnosis not present

## 2018-05-12 DIAGNOSIS — R5383 Other fatigue: Secondary | ICD-10-CM | POA: Diagnosis not present

## 2018-05-12 DIAGNOSIS — M109 Gout, unspecified: Secondary | ICD-10-CM | POA: Diagnosis not present

## 2018-05-12 DIAGNOSIS — H353222 Exudative age-related macular degeneration, left eye, with inactive choroidal neovascularization: Secondary | ICD-10-CM | POA: Diagnosis not present

## 2018-05-12 DIAGNOSIS — Z0000 Encounter for general adult medical examination without abnormal findings: Secondary | ICD-10-CM | POA: Diagnosis not present

## 2018-05-12 DIAGNOSIS — H353212 Exudative age-related macular degeneration, right eye, with inactive choroidal neovascularization: Secondary | ICD-10-CM | POA: Diagnosis not present

## 2018-05-12 DIAGNOSIS — E538 Deficiency of other specified B group vitamins: Secondary | ICD-10-CM | POA: Diagnosis not present

## 2018-05-12 DIAGNOSIS — E1140 Type 2 diabetes mellitus with diabetic neuropathy, unspecified: Secondary | ICD-10-CM | POA: Diagnosis not present

## 2018-05-12 DIAGNOSIS — Z23 Encounter for immunization: Secondary | ICD-10-CM | POA: Diagnosis not present

## 2018-05-12 DIAGNOSIS — Z8249 Family history of ischemic heart disease and other diseases of the circulatory system: Secondary | ICD-10-CM | POA: Diagnosis not present

## 2018-05-22 DIAGNOSIS — R195 Other fecal abnormalities: Secondary | ICD-10-CM | POA: Diagnosis not present

## 2018-06-17 DIAGNOSIS — Z79899 Other long term (current) drug therapy: Secondary | ICD-10-CM | POA: Diagnosis not present

## 2018-06-17 DIAGNOSIS — Z125 Encounter for screening for malignant neoplasm of prostate: Secondary | ICD-10-CM | POA: Diagnosis not present

## 2018-06-17 DIAGNOSIS — R195 Other fecal abnormalities: Secondary | ICD-10-CM | POA: Diagnosis not present

## 2018-06-17 DIAGNOSIS — E782 Mixed hyperlipidemia: Secondary | ICD-10-CM | POA: Diagnosis not present

## 2018-06-24 DIAGNOSIS — M4727 Other spondylosis with radiculopathy, lumbosacral region: Secondary | ICD-10-CM | POA: Diagnosis not present

## 2018-06-24 DIAGNOSIS — Z0000 Encounter for general adult medical examination without abnormal findings: Secondary | ICD-10-CM | POA: Diagnosis not present

## 2018-06-24 DIAGNOSIS — R195 Other fecal abnormalities: Secondary | ICD-10-CM | POA: Diagnosis not present

## 2018-06-24 DIAGNOSIS — F0280 Dementia in other diseases classified elsewhere without behavioral disturbance: Secondary | ICD-10-CM | POA: Diagnosis not present

## 2018-06-24 DIAGNOSIS — G903 Multi-system degeneration of the autonomic nervous system: Secondary | ICD-10-CM | POA: Diagnosis not present

## 2018-06-24 DIAGNOSIS — Z125 Encounter for screening for malignant neoplasm of prostate: Secondary | ICD-10-CM | POA: Diagnosis not present

## 2018-06-24 DIAGNOSIS — E782 Mixed hyperlipidemia: Secondary | ICD-10-CM | POA: Diagnosis not present

## 2018-06-24 DIAGNOSIS — G20 Parkinson's disease: Secondary | ICD-10-CM | POA: Diagnosis not present

## 2018-07-09 ENCOUNTER — Other Ambulatory Visit: Payer: Self-pay

## 2018-07-09 ENCOUNTER — Other Ambulatory Visit
Admission: RE | Admit: 2018-07-09 | Discharge: 2018-07-09 | Disposition: A | Discharge status: Home / Self Care | Payer: PPO | Source: Ambulatory Visit | Attending: Unknown Physician Specialty | Admitting: Unknown Physician Specialty

## 2018-07-09 DIAGNOSIS — Z1159 Encounter for screening for other viral diseases: Secondary | ICD-10-CM | POA: Insufficient documentation

## 2018-07-09 DIAGNOSIS — Z01812 Encounter for preprocedural laboratory examination: Secondary | ICD-10-CM | POA: Insufficient documentation

## 2018-07-10 LAB — NOVEL CORONAVIRUS, NAA (HOSP ORDER, SEND-OUT TO REF LAB; TAT 18-24 HRS): SARS-CoV-2, NAA: NOT DETECTED

## 2018-07-14 ENCOUNTER — Encounter: Payer: Self-pay | Admitting: *Deleted

## 2018-07-14 ENCOUNTER — Encounter
Admission: RE | Disposition: A | Discharge status: Home / Self Care | Payer: Self-pay | Source: Home / Self Care | Attending: Unknown Physician Specialty

## 2018-07-14 ENCOUNTER — Ambulatory Visit
Admission: RE | Admit: 2018-07-14 | Discharge: 2018-07-14 | Disposition: A | Discharge status: Home / Self Care | Payer: PPO | Attending: Unknown Physician Specialty | Admitting: Unknown Physician Specialty

## 2018-07-14 ENCOUNTER — Other Ambulatory Visit: Payer: Self-pay

## 2018-07-14 ENCOUNTER — Ambulatory Visit: Payer: PPO | Admitting: Anesthesiology

## 2018-07-14 DIAGNOSIS — N400 Enlarged prostate without lower urinary tract symptoms: Secondary | ICD-10-CM | POA: Diagnosis not present

## 2018-07-14 DIAGNOSIS — G20 Parkinson's disease: Secondary | ICD-10-CM | POA: Insufficient documentation

## 2018-07-14 DIAGNOSIS — H409 Unspecified glaucoma: Secondary | ICD-10-CM | POA: Diagnosis not present

## 2018-07-14 DIAGNOSIS — D123 Benign neoplasm of transverse colon: Secondary | ICD-10-CM | POA: Insufficient documentation

## 2018-07-14 DIAGNOSIS — K5730 Diverticulosis of large intestine without perforation or abscess without bleeding: Secondary | ICD-10-CM | POA: Diagnosis not present

## 2018-07-14 DIAGNOSIS — I959 Hypotension, unspecified: Secondary | ICD-10-CM | POA: Insufficient documentation

## 2018-07-14 DIAGNOSIS — Z8546 Personal history of malignant neoplasm of prostate: Secondary | ICD-10-CM | POA: Diagnosis not present

## 2018-07-14 DIAGNOSIS — M1990 Unspecified osteoarthritis, unspecified site: Secondary | ICD-10-CM | POA: Diagnosis not present

## 2018-07-14 DIAGNOSIS — F0280 Dementia in other diseases classified elsewhere without behavioral disturbance: Secondary | ICD-10-CM | POA: Insufficient documentation

## 2018-07-14 DIAGNOSIS — K640 First degree hemorrhoids: Secondary | ICD-10-CM | POA: Insufficient documentation

## 2018-07-14 DIAGNOSIS — Z87442 Personal history of urinary calculi: Secondary | ICD-10-CM | POA: Diagnosis not present

## 2018-07-14 DIAGNOSIS — Z85828 Personal history of other malignant neoplasm of skin: Secondary | ICD-10-CM | POA: Insufficient documentation

## 2018-07-14 DIAGNOSIS — R195 Other fecal abnormalities: Secondary | ICD-10-CM | POA: Diagnosis present

## 2018-07-14 DIAGNOSIS — D126 Benign neoplasm of colon, unspecified: Secondary | ICD-10-CM | POA: Diagnosis not present

## 2018-07-14 DIAGNOSIS — F419 Anxiety disorder, unspecified: Secondary | ICD-10-CM | POA: Diagnosis not present

## 2018-07-14 DIAGNOSIS — Z79899 Other long term (current) drug therapy: Secondary | ICD-10-CM | POA: Insufficient documentation

## 2018-07-14 HISTORY — DX: Benign prostatic hyperplasia without lower urinary tract symptoms: N40.0

## 2018-07-14 HISTORY — DX: Multi-system degeneration of the autonomic nervous system: G90.3

## 2018-07-14 HISTORY — DX: Unspecified dementia, unspecified severity, without behavioral disturbance, psychotic disturbance, mood disturbance, and anxiety: F03.90

## 2018-07-14 HISTORY — DX: Unspecified osteoarthritis, unspecified site: M19.90

## 2018-07-14 HISTORY — PX: COLONOSCOPY WITH PROPOFOL: SHX5780

## 2018-07-14 HISTORY — DX: Pure hyperglyceridemia: E78.1

## 2018-07-14 SURGERY — COLONOSCOPY WITH PROPOFOL
Anesthesia: General

## 2018-07-14 MED ORDER — SODIUM CHLORIDE 0.9 % IV SOLN: INTRAVENOUS | Status: DC

## 2018-07-14 MED ORDER — LIDOCAINE HCL (PF) 2 % IJ SOLN
INTRAMUSCULAR | Status: DC | PRN
  Administered 2018-07-14: 60 mg

## 2018-07-14 MED ORDER — PROPOFOL 500 MG/50ML IV EMUL
INTRAVENOUS | Status: AC
  Filled 2018-07-14: qty 50

## 2018-07-14 MED ORDER — LIDOCAINE HCL (PF) 2 % IJ SOLN
INTRAMUSCULAR | Status: AC
  Filled 2018-07-14: qty 10

## 2018-07-14 MED ORDER — SODIUM CHLORIDE 0.9 % IV SOLN
INTRAVENOUS | Status: DC
  Administered 2018-07-14: 1000 mL via INTRAVENOUS

## 2018-07-14 MED ORDER — PROPOFOL 10 MG/ML IV BOLUS
INTRAVENOUS | Status: DC | PRN
  Administered 2018-07-14: 10 mg via INTRAVENOUS
  Administered 2018-07-14: 20 mg via INTRAVENOUS
  Administered 2018-07-14: 10 mg via INTRAVENOUS

## 2018-07-14 MED ORDER — MIDAZOLAM HCL 2 MG/2ML IJ SOLN
INTRAMUSCULAR | Status: AC
  Filled 2018-07-14: qty 2

## 2018-07-14 MED ORDER — FENTANYL CITRATE (PF) 100 MCG/2ML IJ SOLN
INTRAMUSCULAR | Status: AC
  Filled 2018-07-14: qty 2

## 2018-07-14 MED ORDER — PROPOFOL 500 MG/50ML IV EMUL
INTRAVENOUS | Status: DC | PRN
  Administered 2018-07-14: 25 ug/kg/min via INTRAVENOUS

## 2018-07-14 NOTE — H&P (Signed)
Primary Care Physician:  Rusty Aus, MD Primary Gastroenterologist:  Dr. Vira Agar  Pre-Procedure History & Physical: HPI:  Steven Buckley is a 78 y.o. male is here for an colonoscopy.   Past Medical History:  Diagnosis Date  . Anxiety   . Arthritis    osteoarthritis  . BPH (benign prostatic hyperplasia)   . Cancer [facility]) 2007   prostate, treated with seeds. skin cancer also  . Dementia ([place])   . Dizziness 04/2017   looses balance frequently but no falls  . GERD (gastroesophageal reflux disease)   . Glaucoma    bilateral  . History of kidney stones   . Hypertriglyceridemia   . Macular degeneration   . Neurogenic orthostatic hypotension ([place])   . Orthostatic hypotension   . Parkinson's disease [facility])     Past Surgical History:  Procedure Laterality Date  . EYE SURGERY Bilateral    cataract extractions  . HAND SURGERY Right    crushed hand in hay baler  . HERNIA REPAIR Left 1949   inguinal repair  . INGUINAL HERNIA REPAIR Right 05/22/2017   Procedure: HERNIA REPAIR INGUINAL ADULT;  Surgeon: Herbert Pun, MD;  Location: [facility] ORS;  Service: General;  Laterality: Right;  . SHOULDER ARTHROSCOPY Right 1999   rotator cuff repair  . skin cancer removal  2018   squamos cell on nose, basal cell on cheek, back  . TONSILLECTOMY      Prior to Admission medications   Medication Sig Start Date End Date Taking? Authorizing Provider  carbidopa-levodopa (SINEMET IR) 25-100 MG tablet Take 2 tablets by mouth 4 (four) times daily.   Yes [provider]  clonazePAM (KLONOPIN) 0.5 MG tablet Take 0.5 mg by mouth 2 (two) times daily as needed for anxiety.   Yes [provider]  Melatonin 10 MG TABS Take by mouth.   Yes [provider]  midodrine (PROAMATINE) 2.5 MG tablet Take 2.5 mg by mouth 3 (three) times daily with meals.   Yes [provider]  Bevacizumab (AVASTIN IV) Inject 1 Dose into the eye every 6 (six) weeks. Injection into both  eyes every 6 to 11 weeks as indicated for retinal bleeding    [provider]  carbidopa-levodopa (SINEMET CR) 50-200 MG tablet Take 1 tablet by mouth at bedtime.    [provider]  Cholecalciferol (VITAMIN D3) 1000 units CAPS Take 1,000 Units by mouth daily.    [provider]  fludrocortisone (FLORINEF) 0.1 MG tablet Take 0.1 mg by mouth 2 (two) times daily. Given for hypotension related to parkinsons    [provider]  ibuprofen (ADVIL,MOTRIN) 200 MG tablet Take 400 mg by mouth every 6 (six) hours as needed for headache or moderate pain.    [provider]  Inositol Niacinate (NIACIN FLUSH FREE PO) Take 1 tablet by mouth daily.    [provider]  ketoconazole (NIZORAL) 2 % shampoo Apply 1 application topically every 14 (fourteen) days. 04/22/17   [provider]  Multiple Vitamins-Minerals (PRESERVISION AREDS 2 PO) Take 1 capsule by mouth daily.    [provider]  niacin 500 MG tablet Take 1,000 mg by mouth daily.    [provider]  Nutritional Supplements (JUICE PLUS FIBRE PO) Take 6 capsules by mouth daily.    [provider]  Polyethyl Glycol-Propyl Glycol (SYSTANE OP) Place 2 drops into both eyes 3 (three) times daily as needed (for dry eyes).    [provider]  silodosin (  RAPAFLO) 8 MG CAPS capsule Take 8 mg by mouth daily as needed (for bladder). TITRATES DOSE AS NEEDED AND USUALLY TAKES ONLY 1 OF EACH RAPAFLO AND FLOMAX    [provider]  tamsulosin (FLOMAX) 0.4 MG CAPS capsule Take 0.4 mg by mouth daily as needed (for bladder).    [provider]  timolol (BETIMOL) 0.5 % ophthalmic solution Place 1 drop into both eyes daily.    [provider]  vitamin B-12 (CYANOCOBALAMIN) 500 MCG tablet Take 500 mcg by mouth daily. TAKES SUBLINGUILLY    [provider]    Allergies as of 06/26/2018  . (No Known Allergies)    History reviewed. No pertinent  family history.  Social History   Socioeconomic History  . Marital status: Married    Spouse name: Not on file  . Number of children: Not on file  . Years of education: Not on file  . Highest education level: Not on file  Occupational History  . Not on file  Social Needs  . Financial resource strain: Not on file  . Food insecurity    Worry: Not on file    Inability: Not on file  . Transportation needs    Medical: Not on file    Non-medical: Not on file  Tobacco Use  . Smoking status: Former Smoker    Packs/day: 1.00    Types: Cigarettes    Quit date: 01/30/1967    Years since quitting: 51.4  . Smokeless tobacco: Former Systems developer  . Tobacco comment: tried smokeless tobacco but did not like it  Substance and Sexual Activity  . Alcohol use: Yes    Alcohol/week: 1.0 standard drinks    Types: 1 Glasses of wine per week    Comment: once a week, maybe  . Drug use: Never  . Sexual activity: Not Currently  Lifestyle  . Physical activity    Days per week: Not on file    Minutes per session: Not on file  . Stress: Not on file  Relationships  . Social Herbalist on phone: Not on file    Gets together: Not on file    Attends religious service: Not on file    Active member of club or organization: Not on file    Attends meetings of clubs or organizations: Not on file    Relationship status: Not on file  . Intimate partner violence    Fear of current or ex partner: Not on file    Emotionally abused: Not on file    Physically abused: Not on file    Forced sexual activity: Not on file  Other Topics Concern  . Not on file  Social History Narrative  . Not on file    Review of Systems: See HPI, otherwise negative ROS  Physical Exam: BP 117/85   Pulse 68   Temp (!) 97.5 F (36.4 C) (Tympanic)   Resp 16   Ht 5\' 11"  (1.803 m)   Wt 80.7 kg   SpO2 99%   BMI 24.83 kg/m  General:   Alert,  pleasant and cooperative in NAD Head:  Normocephalic and atraumatic. Neck:   Supple; no masses or thyromegaly. Lungs:  Clear throughout to auscultation.    Heart:  Regular rate and rhythm. Abdomen:  Soft, nontender and nondistended. Normal bowel sounds, without guarding, and without rebound.   Neurologic:  Alert and  oriented x4;  grossly normal neurologically.  Impression/Plan: Steven Buckley is here for an  colonoscopy to be performed for positive Cologuard test.  Risks, benefits, limitations, and alternatives regarding  colonoscopy have been reviewed with the patient.  Questions have been answered.  All parties agreeable.   Gaylyn Cheers, MD  07/14/2018, 8:32 AM

## 2018-07-14 NOTE — Anesthesia Post-op Follow-up Note (Signed)
Anesthesia QCDR form completed.        

## 2018-07-14 NOTE — Anesthesia Preprocedure Evaluation (Signed)
Anesthesia Evaluation  Patient identified by MRN, date of birth, ID band Patient awake    Reviewed: Allergy & Precautions, NPO status , Patient's Chart, lab work & pertinent test results  History of Anesthesia Complications Negative for: history of anesthetic complications  Airway Mallampati: II       Dental   Pulmonary neg sleep apnea, neg COPD, former smoker,           Cardiovascular (-) hypertension(-) Past MI and (-) CHF (-) dysrhythmias (-) Valvular Problems/Murmurs  Hypotension with parkinsons   Neuro/Psych neg Seizures Anxiety Dementia  Neuromuscular disease (parkinsons dz)    GI/Hepatic Neg liver ROS, GERD (no meds)  ,  Endo/Other  neg diabetes  Renal/GU negative Renal ROS     Musculoskeletal  (+) Arthritis ,   Abdominal   Peds  Hematology   Anesthesia Other Findings   Reproductive/Obstetrics                             Anesthesia Physical Anesthesia Plan  ASA: III  Anesthesia Plan: General   Post-op Pain Management:    Induction: Intravenous  PONV Risk Score and Plan: 2 and TIVA and Midazolam  Airway Management Planned: Nasal Cannula  Additional Equipment:   Intra-op Plan:   Post-operative Plan:   Informed Consent: I have reviewed the patients History and Physical, chart, labs and discussed the procedure including the risks, benefits and alternatives for the proposed anesthesia with the patient or authorized representative who has indicated his/her understanding and acceptance.       Plan Discussed with:   Anesthesia Plan Comments:         Anesthesia Quick Evaluation

## 2018-07-14 NOTE — Transfer of Care (Signed)
Immediate Anesthesia Transfer of Care Note  Patient: Steven Buckley  Procedure(s) Performed: COLONOSCOPY WITH PROPOFOL (N/A )  Patient Location: PACU  Anesthesia Type:General  Level of Consciousness: sedated  Airway & Oxygen Therapy: Patient Spontanous Breathing and Patient connected to nasal cannula oxygen  Post-op Assessment: Report given to RN and Post -op Vital signs reviewed and stable  Post vital signs: Reviewed and stable  Last Vitals:  Vitals Value Taken Time  BP 113/70 07/14/18 0907  Temp    Pulse    Resp    SpO2      Last Pain:  Vitals:   07/14/18 0724  TempSrc: Tympanic  PainSc: 0-No pain         Complications: No apparent anesthesia complications

## 2018-07-14 NOTE — Anesthesia Postprocedure Evaluation (Signed)
Anesthesia Post Note  Patient: Steven Buckley  Procedure(s) Performed: COLONOSCOPY WITH PROPOFOL (N/A )  Patient location during evaluation: Endoscopy Anesthesia Type: General Level of consciousness: awake and alert Pain management: pain level controlled Vital Signs Assessment: post-procedure vital signs reviewed and stable Respiratory status: spontaneous breathing and respiratory function stable Cardiovascular status: stable Anesthetic complications: no     Last Vitals:  Vitals:   07/14/18 0907 07/14/18 0917  BP: 113/70 123/84  Pulse: 63   Resp: 11   Temp:    SpO2: 100%     Last Pain:  Vitals:   07/14/18 0917  TempSrc:   PainSc: 0-No pain                 KEPHART,WILLIAM K

## 2018-07-14 NOTE — Op Note (Signed)
[facility] Gastroenterology Patient Name: Steven Buckley Procedure Date: 07/14/2018 8:33 AM MRN: [id number] Account #: [id number] Date of Birth: December 30, 1940 Admit Type: Outpatient Age: 78 Room: [facility] ENDO ROOM 3 Gender: Male Note Status: Finalized Procedure:            Colonoscopy Indications:          Positive Cologuard test Providers:            Manya Silvas, MD Referring MD:         No Local Md, MD (Referring MD) Medicines:            Propofol per Anesthesia Complications:        No immediate complications. Procedure:            Pre-Anesthesia Assessment:                       - After reviewing the risks and benefits, the patient                        was deemed in satisfactory condition to undergo the                        procedure.                       After obtaining informed consent, the colonoscope was                        passed under direct vision. Throughout the procedure,                        the patient's blood pressure, pulse, and oxygen                        saturations were monitored continuously. The                        Colonoscope was introduced through the anus and                        advanced to the the cecum, identified by appendiceal                        orifice and ileocecal valve. The colonoscopy was                        performed without difficulty. The patient tolerated the                        procedure well. The quality of the bowel preparation                        was good. Findings:      A small polyp was found in the transverse colon. The polyp was sessile.       The polyp was removed with a hot snare. Resection and retrieval were       complete.      Internal hemorrhoids were found during endoscopy. The hemorrhoids were       Grade I (internal hemorrhoids that do not prolapse).      The exam was otherwise without abnormality.  Many small-mouthed diverticula were found in the sigmoid colon,   descending colon and transverse colon. Impression:           - One small polyp in the transverse colon, removed with                        a hot snare. Resected and retrieved.                       - Internal hemorrhoids.                       Diverticulosis in sigmoid, descending and transverse                        colon.                       - The examination was otherwise normal. Recommendation:       - Await pathology results. Manya Silvas, MD 07/14/2018 9:11:47 AM This report has been signed electronically. Number of Addenda: 0 Note Initiated On: 07/14/2018 8:33 AM Scope Withdrawal Time: 0 hours 14 minutes 40 seconds  Total Procedure Duration: 0 hours 22 minutes 24 seconds       [facility]

## 2018-07-15 ENCOUNTER — Encounter: Payer: Self-pay | Admitting: Unknown Physician Specialty

## 2018-07-15 LAB — SURGICAL PATHOLOGY

## 2018-08-04 DIAGNOSIS — H353222 Exudative age-related macular degeneration, left eye, with inactive choroidal neovascularization: Secondary | ICD-10-CM | POA: Diagnosis not present

## 2018-08-04 DIAGNOSIS — H353212 Exudative age-related macular degeneration, right eye, with inactive choroidal neovascularization: Secondary | ICD-10-CM | POA: Diagnosis not present

## 2018-08-14 DIAGNOSIS — H401130 Primary open-angle glaucoma, bilateral, stage unspecified: Secondary | ICD-10-CM | POA: Diagnosis not present

## 2018-08-21 DIAGNOSIS — H401132 Primary open-angle glaucoma, bilateral, moderate stage: Secondary | ICD-10-CM | POA: Diagnosis not present

## 2018-09-10 DIAGNOSIS — C61 Malignant neoplasm of prostate: Secondary | ICD-10-CM | POA: Diagnosis not present

## 2018-09-10 DIAGNOSIS — D400 Neoplasm of uncertain behavior of prostate: Secondary | ICD-10-CM | POA: Diagnosis not present

## 2018-10-27 DIAGNOSIS — H353222 Exudative age-related macular degeneration, left eye, with inactive choroidal neovascularization: Secondary | ICD-10-CM | POA: Diagnosis not present

## 2018-10-27 DIAGNOSIS — H353212 Exudative age-related macular degeneration, right eye, with inactive choroidal neovascularization: Secondary | ICD-10-CM | POA: Diagnosis not present

## 2018-12-09 DIAGNOSIS — D225 Melanocytic nevi of trunk: Secondary | ICD-10-CM | POA: Diagnosis not present

## 2018-12-09 DIAGNOSIS — L570 Actinic keratosis: Secondary | ICD-10-CM | POA: Diagnosis not present

## 2018-12-09 DIAGNOSIS — Z08 Encounter for follow-up examination after completed treatment for malignant neoplasm: Secondary | ICD-10-CM | POA: Diagnosis not present

## 2018-12-09 DIAGNOSIS — Z85828 Personal history of other malignant neoplasm of skin: Secondary | ICD-10-CM | POA: Diagnosis not present

## 2018-12-09 DIAGNOSIS — X32XXXA Exposure to sunlight, initial encounter: Secondary | ICD-10-CM | POA: Diagnosis not present

## 2018-12-09 DIAGNOSIS — L218 Other seborrheic dermatitis: Secondary | ICD-10-CM | POA: Diagnosis not present

## 2019-01-19 DIAGNOSIS — H353222 Exudative age-related macular degeneration, left eye, with inactive choroidal neovascularization: Secondary | ICD-10-CM | POA: Diagnosis not present

## 2019-01-19 DIAGNOSIS — H353212 Exudative age-related macular degeneration, right eye, with inactive choroidal neovascularization: Secondary | ICD-10-CM | POA: Diagnosis not present

## 2019-01-19 DIAGNOSIS — H401130 Primary open-angle glaucoma, bilateral, stage unspecified: Secondary | ICD-10-CM | POA: Diagnosis not present

## 2019-01-29 DIAGNOSIS — Z23 Encounter for immunization: Secondary | ICD-10-CM | POA: Diagnosis not present

## 2019-01-29 DIAGNOSIS — L03115 Cellulitis of right lower limb: Secondary | ICD-10-CM | POA: Diagnosis not present

## 2019-02-05 DIAGNOSIS — Z20822 Contact with and (suspected) exposure to covid-19: Secondary | ICD-10-CM | POA: Diagnosis not present

## 2019-02-05 DIAGNOSIS — Z20828 Contact with and (suspected) exposure to other viral communicable diseases: Secondary | ICD-10-CM | POA: Diagnosis not present

## 2019-02-27 DIAGNOSIS — H401130 Primary open-angle glaucoma, bilateral, stage unspecified: Secondary | ICD-10-CM | POA: Diagnosis not present

## 2019-03-06 DIAGNOSIS — B349 Viral infection, unspecified: Secondary | ICD-10-CM | POA: Diagnosis not present

## 2019-03-06 DIAGNOSIS — J208 Acute bronchitis due to other specified organisms: Secondary | ICD-10-CM | POA: Diagnosis not present

## 2019-03-06 DIAGNOSIS — U071 COVID-19: Secondary | ICD-10-CM | POA: Diagnosis not present

## 2019-03-06 DIAGNOSIS — R05 Cough: Secondary | ICD-10-CM | POA: Diagnosis not present

## 2019-03-09 DIAGNOSIS — J1282 Pneumonia due to coronavirus disease 2019: Secondary | ICD-10-CM | POA: Diagnosis not present

## 2019-03-09 DIAGNOSIS — G903 Multi-system degeneration of the autonomic nervous system: Secondary | ICD-10-CM | POA: Diagnosis not present

## 2019-03-09 DIAGNOSIS — M4727 Other spondylosis with radiculopathy, lumbosacral region: Secondary | ICD-10-CM | POA: Diagnosis not present

## 2019-03-09 DIAGNOSIS — U071 COVID-19: Secondary | ICD-10-CM | POA: Diagnosis not present

## 2019-03-11 DIAGNOSIS — J208 Acute bronchitis due to other specified organisms: Secondary | ICD-10-CM | POA: Diagnosis not present

## 2019-03-11 DIAGNOSIS — U071 COVID-19: Secondary | ICD-10-CM | POA: Diagnosis not present

## 2019-04-20 DIAGNOSIS — H353222 Exudative age-related macular degeneration, left eye, with inactive choroidal neovascularization: Secondary | ICD-10-CM | POA: Diagnosis not present

## 2019-04-20 DIAGNOSIS — H353212 Exudative age-related macular degeneration, right eye, with inactive choroidal neovascularization: Secondary | ICD-10-CM | POA: Diagnosis not present

## 2019-04-23 DIAGNOSIS — E440 Moderate protein-calorie malnutrition: Secondary | ICD-10-CM | POA: Diagnosis not present

## 2019-04-23 DIAGNOSIS — G20 Parkinson's disease: Secondary | ICD-10-CM | POA: Diagnosis not present

## 2019-04-23 DIAGNOSIS — G4752 REM sleep behavior disorder: Secondary | ICD-10-CM | POA: Diagnosis not present

## 2019-04-23 DIAGNOSIS — R634 Abnormal weight loss: Secondary | ICD-10-CM | POA: Diagnosis not present

## 2019-04-23 DIAGNOSIS — G4710 Hypersomnia, unspecified: Secondary | ICD-10-CM | POA: Diagnosis not present

## 2019-04-23 DIAGNOSIS — R0602 Shortness of breath: Secondary | ICD-10-CM | POA: Diagnosis not present

## 2019-04-23 DIAGNOSIS — F0280 Dementia in other diseases classified elsewhere without behavioral disturbance: Secondary | ICD-10-CM | POA: Diagnosis not present

## 2019-04-23 DIAGNOSIS — G903 Multi-system degeneration of the autonomic nervous system: Secondary | ICD-10-CM | POA: Diagnosis not present

## 2019-04-28 DIAGNOSIS — R7989 Other specified abnormal findings of blood chemistry: Secondary | ICD-10-CM | POA: Diagnosis not present

## 2019-04-28 DIAGNOSIS — R946 Abnormal results of thyroid function studies: Secondary | ICD-10-CM | POA: Diagnosis not present

## 2019-05-05 DIAGNOSIS — M6281 Muscle weakness (generalized): Secondary | ICD-10-CM | POA: Diagnosis not present

## 2019-05-05 DIAGNOSIS — R296 Repeated falls: Secondary | ICD-10-CM | POA: Diagnosis not present

## 2019-05-05 DIAGNOSIS — G20 Parkinson's disease: Secondary | ICD-10-CM | POA: Diagnosis not present

## 2019-05-06 DIAGNOSIS — M6281 Muscle weakness (generalized): Secondary | ICD-10-CM | POA: Diagnosis not present

## 2019-05-06 DIAGNOSIS — R296 Repeated falls: Secondary | ICD-10-CM | POA: Diagnosis not present

## 2019-05-06 DIAGNOSIS — G20 Parkinson's disease: Secondary | ICD-10-CM | POA: Diagnosis not present

## 2019-05-08 DIAGNOSIS — M6281 Muscle weakness (generalized): Secondary | ICD-10-CM | POA: Diagnosis not present

## 2019-05-08 DIAGNOSIS — R296 Repeated falls: Secondary | ICD-10-CM | POA: Diagnosis not present

## 2019-05-08 DIAGNOSIS — G20 Parkinson's disease: Secondary | ICD-10-CM | POA: Diagnosis not present

## 2019-05-11 DIAGNOSIS — M6281 Muscle weakness (generalized): Secondary | ICD-10-CM | POA: Diagnosis not present

## 2019-05-11 DIAGNOSIS — R296 Repeated falls: Secondary | ICD-10-CM | POA: Diagnosis not present

## 2019-05-11 DIAGNOSIS — G20 Parkinson's disease: Secondary | ICD-10-CM | POA: Diagnosis not present

## 2019-05-14 DIAGNOSIS — G20 Parkinson's disease: Secondary | ICD-10-CM | POA: Diagnosis not present

## 2019-05-14 DIAGNOSIS — R296 Repeated falls: Secondary | ICD-10-CM | POA: Diagnosis not present

## 2019-05-14 DIAGNOSIS — M6281 Muscle weakness (generalized): Secondary | ICD-10-CM | POA: Diagnosis not present

## 2019-05-15 DIAGNOSIS — G20 Parkinson's disease: Secondary | ICD-10-CM | POA: Diagnosis not present

## 2019-05-15 DIAGNOSIS — R296 Repeated falls: Secondary | ICD-10-CM | POA: Diagnosis not present

## 2019-05-15 DIAGNOSIS — M6281 Muscle weakness (generalized): Secondary | ICD-10-CM | POA: Diagnosis not present

## 2019-05-19 DIAGNOSIS — R296 Repeated falls: Secondary | ICD-10-CM | POA: Diagnosis not present

## 2019-05-19 DIAGNOSIS — G20 Parkinson's disease: Secondary | ICD-10-CM | POA: Diagnosis not present

## 2019-05-19 DIAGNOSIS — M6281 Muscle weakness (generalized): Secondary | ICD-10-CM | POA: Diagnosis not present

## 2019-05-21 DIAGNOSIS — R296 Repeated falls: Secondary | ICD-10-CM | POA: Diagnosis not present

## 2019-05-21 DIAGNOSIS — G20 Parkinson's disease: Secondary | ICD-10-CM | POA: Diagnosis not present

## 2019-05-21 DIAGNOSIS — M6281 Muscle weakness (generalized): Secondary | ICD-10-CM | POA: Diagnosis not present

## 2019-05-29 DIAGNOSIS — R296 Repeated falls: Secondary | ICD-10-CM | POA: Diagnosis not present

## 2019-05-29 DIAGNOSIS — G20 Parkinson's disease: Secondary | ICD-10-CM | POA: Diagnosis not present

## 2019-05-29 DIAGNOSIS — M6281 Muscle weakness (generalized): Secondary | ICD-10-CM | POA: Diagnosis not present

## 2019-06-02 DIAGNOSIS — R296 Repeated falls: Secondary | ICD-10-CM | POA: Diagnosis not present

## 2019-06-02 DIAGNOSIS — M6281 Muscle weakness (generalized): Secondary | ICD-10-CM | POA: Diagnosis not present

## 2019-06-02 DIAGNOSIS — G20 Parkinson's disease: Secondary | ICD-10-CM | POA: Diagnosis not present

## 2019-06-04 DIAGNOSIS — E0590 Thyrotoxicosis, unspecified without thyrotoxic crisis or storm: Secondary | ICD-10-CM | POA: Diagnosis not present

## 2019-06-05 DIAGNOSIS — G20 Parkinson's disease: Secondary | ICD-10-CM | POA: Diagnosis not present

## 2019-06-05 DIAGNOSIS — R296 Repeated falls: Secondary | ICD-10-CM | POA: Diagnosis not present

## 2019-06-05 DIAGNOSIS — M6281 Muscle weakness (generalized): Secondary | ICD-10-CM | POA: Diagnosis not present

## 2019-06-09 DIAGNOSIS — M6281 Muscle weakness (generalized): Secondary | ICD-10-CM | POA: Diagnosis not present

## 2019-06-09 DIAGNOSIS — G20 Parkinson's disease: Secondary | ICD-10-CM | POA: Diagnosis not present

## 2019-06-09 DIAGNOSIS — R296 Repeated falls: Secondary | ICD-10-CM | POA: Diagnosis not present

## 2019-06-12 DIAGNOSIS — R296 Repeated falls: Secondary | ICD-10-CM | POA: Diagnosis not present

## 2019-06-12 DIAGNOSIS — G20 Parkinson's disease: Secondary | ICD-10-CM | POA: Diagnosis not present

## 2019-06-12 DIAGNOSIS — M6281 Muscle weakness (generalized): Secondary | ICD-10-CM | POA: Diagnosis not present

## 2019-06-15 DIAGNOSIS — M6281 Muscle weakness (generalized): Secondary | ICD-10-CM | POA: Diagnosis not present

## 2019-06-15 DIAGNOSIS — R296 Repeated falls: Secondary | ICD-10-CM | POA: Diagnosis not present

## 2019-06-15 DIAGNOSIS — G20 Parkinson's disease: Secondary | ICD-10-CM | POA: Diagnosis not present

## 2019-06-16 DIAGNOSIS — E782 Mixed hyperlipidemia: Secondary | ICD-10-CM | POA: Diagnosis not present

## 2019-06-16 DIAGNOSIS — F0280 Dementia in other diseases classified elsewhere without behavioral disturbance: Secondary | ICD-10-CM | POA: Diagnosis not present

## 2019-06-16 DIAGNOSIS — Z125 Encounter for screening for malignant neoplasm of prostate: Secondary | ICD-10-CM | POA: Diagnosis not present

## 2019-06-16 DIAGNOSIS — G20 Parkinson's disease: Secondary | ICD-10-CM | POA: Diagnosis not present

## 2019-06-16 DIAGNOSIS — G903 Multi-system degeneration of the autonomic nervous system: Secondary | ICD-10-CM | POA: Diagnosis not present

## 2019-06-18 DIAGNOSIS — G20 Parkinson's disease: Secondary | ICD-10-CM | POA: Diagnosis not present

## 2019-06-18 DIAGNOSIS — M6281 Muscle weakness (generalized): Secondary | ICD-10-CM | POA: Diagnosis not present

## 2019-06-18 DIAGNOSIS — R296 Repeated falls: Secondary | ICD-10-CM | POA: Diagnosis not present

## 2019-06-23 DIAGNOSIS — R296 Repeated falls: Secondary | ICD-10-CM | POA: Diagnosis not present

## 2019-06-23 DIAGNOSIS — M6281 Muscle weakness (generalized): Secondary | ICD-10-CM | POA: Diagnosis not present

## 2019-06-23 DIAGNOSIS — G20 Parkinson's disease: Secondary | ICD-10-CM | POA: Diagnosis not present

## 2019-06-24 DIAGNOSIS — Z0000 Encounter for general adult medical examination without abnormal findings: Secondary | ICD-10-CM | POA: Diagnosis not present

## 2019-06-24 DIAGNOSIS — G20 Parkinson's disease: Secondary | ICD-10-CM | POA: Diagnosis not present

## 2019-06-24 DIAGNOSIS — F0280 Dementia in other diseases classified elsewhere without behavioral disturbance: Secondary | ICD-10-CM | POA: Diagnosis not present

## 2019-06-24 DIAGNOSIS — M4727 Other spondylosis with radiculopathy, lumbosacral region: Secondary | ICD-10-CM | POA: Diagnosis not present

## 2019-06-24 DIAGNOSIS — E538 Deficiency of other specified B group vitamins: Secondary | ICD-10-CM | POA: Diagnosis not present

## 2019-06-24 DIAGNOSIS — E0590 Thyrotoxicosis, unspecified without thyrotoxic crisis or storm: Secondary | ICD-10-CM | POA: Diagnosis not present

## 2019-06-24 DIAGNOSIS — G903 Multi-system degeneration of the autonomic nervous system: Secondary | ICD-10-CM | POA: Diagnosis not present

## 2019-06-25 DIAGNOSIS — G20 Parkinson's disease: Secondary | ICD-10-CM | POA: Diagnosis not present

## 2019-06-25 DIAGNOSIS — R296 Repeated falls: Secondary | ICD-10-CM | POA: Diagnosis not present

## 2019-06-25 DIAGNOSIS — M6281 Muscle weakness (generalized): Secondary | ICD-10-CM | POA: Diagnosis not present

## 2019-07-21 DIAGNOSIS — H353212 Exudative age-related macular degeneration, right eye, with inactive choroidal neovascularization: Secondary | ICD-10-CM | POA: Diagnosis not present

## 2019-07-21 DIAGNOSIS — H353222 Exudative age-related macular degeneration, left eye, with inactive choroidal neovascularization: Secondary | ICD-10-CM | POA: Diagnosis not present

## 2019-08-10 DIAGNOSIS — S81802D Unspecified open wound, left lower leg, subsequent encounter: Secondary | ICD-10-CM | POA: Diagnosis not present

## 2019-08-10 DIAGNOSIS — G20 Parkinson's disease: Secondary | ICD-10-CM | POA: Diagnosis not present

## 2019-08-10 DIAGNOSIS — I878 Other specified disorders of veins: Secondary | ICD-10-CM | POA: Diagnosis not present

## 2019-08-13 DIAGNOSIS — S81802D Unspecified open wound, left lower leg, subsequent encounter: Secondary | ICD-10-CM | POA: Diagnosis not present

## 2019-08-13 DIAGNOSIS — I878 Other specified disorders of veins: Secondary | ICD-10-CM | POA: Diagnosis not present

## 2019-08-21 DIAGNOSIS — G4710 Hypersomnia, unspecified: Secondary | ICD-10-CM | POA: Diagnosis not present

## 2019-08-21 DIAGNOSIS — F0280 Dementia in other diseases classified elsewhere without behavioral disturbance: Secondary | ICD-10-CM | POA: Diagnosis not present

## 2019-08-21 DIAGNOSIS — G903 Multi-system degeneration of the autonomic nervous system: Secondary | ICD-10-CM | POA: Diagnosis not present

## 2019-08-21 DIAGNOSIS — G20 Parkinson's disease: Secondary | ICD-10-CM | POA: Diagnosis not present

## 2019-08-21 DIAGNOSIS — G4752 REM sleep behavior disorder: Secondary | ICD-10-CM | POA: Diagnosis not present

## 2019-08-31 DIAGNOSIS — M4727 Other spondylosis with radiculopathy, lumbosacral region: Secondary | ICD-10-CM | POA: Diagnosis not present

## 2019-08-31 DIAGNOSIS — Z111 Encounter for screening for respiratory tuberculosis: Secondary | ICD-10-CM | POA: Diagnosis not present

## 2019-08-31 DIAGNOSIS — G20 Parkinson's disease: Secondary | ICD-10-CM | POA: Diagnosis not present

## 2019-09-08 DIAGNOSIS — E0590 Thyrotoxicosis, unspecified without thyrotoxic crisis or storm: Secondary | ICD-10-CM | POA: Diagnosis not present

## 2019-09-15 DIAGNOSIS — H168 Other keratitis: Secondary | ICD-10-CM | POA: Diagnosis not present

## 2019-09-15 DIAGNOSIS — E0590 Thyrotoxicosis, unspecified without thyrotoxic crisis or storm: Secondary | ICD-10-CM | POA: Diagnosis not present

## 2019-09-30 DIAGNOSIS — D400 Neoplasm of uncertain behavior of prostate: Secondary | ICD-10-CM | POA: Diagnosis not present

## 2019-09-30 DIAGNOSIS — C61 Malignant neoplasm of prostate: Secondary | ICD-10-CM | POA: Diagnosis not present

## 2019-10-01 DIAGNOSIS — C61 Malignant neoplasm of prostate: Secondary | ICD-10-CM | POA: Diagnosis not present

## 2019-10-01 DIAGNOSIS — D400 Neoplasm of uncertain behavior of prostate: Secondary | ICD-10-CM | POA: Diagnosis not present

## 2019-10-22 DIAGNOSIS — Z23 Encounter for immunization: Secondary | ICD-10-CM | POA: Diagnosis not present

## 2019-10-30 DIAGNOSIS — H401130 Primary open-angle glaucoma, bilateral, stage unspecified: Secondary | ICD-10-CM | POA: Diagnosis not present

## 2019-11-03 DIAGNOSIS — H353212 Exudative age-related macular degeneration, right eye, with inactive choroidal neovascularization: Secondary | ICD-10-CM | POA: Diagnosis not present

## 2019-11-03 DIAGNOSIS — H353222 Exudative age-related macular degeneration, left eye, with inactive choroidal neovascularization: Secondary | ICD-10-CM | POA: Diagnosis not present

## 2019-11-05 DIAGNOSIS — H401132 Primary open-angle glaucoma, bilateral, moderate stage: Secondary | ICD-10-CM | POA: Diagnosis not present

## 2019-11-19 DIAGNOSIS — G903 Multi-system degeneration of the autonomic nervous system: Secondary | ICD-10-CM | POA: Diagnosis not present

## 2019-11-19 DIAGNOSIS — G20 Parkinson's disease: Secondary | ICD-10-CM | POA: Diagnosis not present

## 2019-11-19 DIAGNOSIS — G4710 Hypersomnia, unspecified: Secondary | ICD-10-CM | POA: Diagnosis not present

## 2019-11-19 DIAGNOSIS — F0280 Dementia in other diseases classified elsewhere without behavioral disturbance: Secondary | ICD-10-CM | POA: Diagnosis not present

## 2019-11-19 DIAGNOSIS — G4752 REM sleep behavior disorder: Secondary | ICD-10-CM | POA: Diagnosis not present

## 2019-12-01 DIAGNOSIS — D485 Neoplasm of uncertain behavior of skin: Secondary | ICD-10-CM | POA: Diagnosis not present

## 2019-12-01 DIAGNOSIS — D225 Melanocytic nevi of trunk: Secondary | ICD-10-CM | POA: Diagnosis not present

## 2019-12-01 DIAGNOSIS — D2261 Melanocytic nevi of right upper limb, including shoulder: Secondary | ICD-10-CM | POA: Diagnosis not present

## 2019-12-01 DIAGNOSIS — D2271 Melanocytic nevi of right lower limb, including hip: Secondary | ICD-10-CM | POA: Diagnosis not present

## 2019-12-01 DIAGNOSIS — D0439 Carcinoma in situ of skin of other parts of face: Secondary | ICD-10-CM | POA: Diagnosis not present

## 2019-12-01 DIAGNOSIS — L570 Actinic keratosis: Secondary | ICD-10-CM | POA: Diagnosis not present

## 2019-12-01 DIAGNOSIS — X32XXXA Exposure to sunlight, initial encounter: Secondary | ICD-10-CM | POA: Diagnosis not present

## 2019-12-01 DIAGNOSIS — D2262 Melanocytic nevi of left upper limb, including shoulder: Secondary | ICD-10-CM | POA: Diagnosis not present

## 2019-12-01 DIAGNOSIS — Z85828 Personal history of other malignant neoplasm of skin: Secondary | ICD-10-CM | POA: Diagnosis not present

## 2019-12-22 DIAGNOSIS — G20 Parkinson's disease: Secondary | ICD-10-CM | POA: Diagnosis not present

## 2019-12-22 DIAGNOSIS — E538 Deficiency of other specified B group vitamins: Secondary | ICD-10-CM | POA: Diagnosis not present

## 2019-12-22 DIAGNOSIS — E0590 Thyrotoxicosis, unspecified without thyrotoxic crisis or storm: Secondary | ICD-10-CM | POA: Diagnosis not present

## 2019-12-29 DIAGNOSIS — G20 Parkinson's disease: Secondary | ICD-10-CM | POA: Diagnosis not present

## 2019-12-29 DIAGNOSIS — E538 Deficiency of other specified B group vitamins: Secondary | ICD-10-CM | POA: Diagnosis not present

## 2019-12-29 DIAGNOSIS — Z125 Encounter for screening for malignant neoplasm of prostate: Secondary | ICD-10-CM | POA: Diagnosis not present

## 2019-12-29 DIAGNOSIS — E782 Mixed hyperlipidemia: Secondary | ICD-10-CM | POA: Diagnosis not present

## 2020-02-09 DIAGNOSIS — H353212 Exudative age-related macular degeneration, right eye, with inactive choroidal neovascularization: Secondary | ICD-10-CM | POA: Diagnosis not present

## 2020-02-09 DIAGNOSIS — H401130 Primary open-angle glaucoma, bilateral, stage unspecified: Secondary | ICD-10-CM | POA: Diagnosis not present

## 2020-02-09 DIAGNOSIS — H353222 Exudative age-related macular degeneration, left eye, with inactive choroidal neovascularization: Secondary | ICD-10-CM | POA: Diagnosis not present

## 2020-03-04 DIAGNOSIS — H04123 Dry eye syndrome of bilateral lacrimal glands: Secondary | ICD-10-CM | POA: Diagnosis not present

## 2020-03-24 DIAGNOSIS — E0590 Thyrotoxicosis, unspecified without thyrotoxic crisis or storm: Secondary | ICD-10-CM | POA: Diagnosis not present

## 2020-03-28 DIAGNOSIS — R0600 Dyspnea, unspecified: Secondary | ICD-10-CM | POA: Diagnosis not present

## 2020-03-28 DIAGNOSIS — G20 Parkinson's disease: Secondary | ICD-10-CM | POA: Diagnosis not present

## 2020-03-28 DIAGNOSIS — K219 Gastro-esophageal reflux disease without esophagitis: Secondary | ICD-10-CM | POA: Diagnosis not present

## 2020-04-12 DIAGNOSIS — L570 Actinic keratosis: Secondary | ICD-10-CM | POA: Diagnosis not present

## 2020-04-12 DIAGNOSIS — X32XXXA Exposure to sunlight, initial encounter: Secondary | ICD-10-CM | POA: Diagnosis not present

## 2020-04-12 DIAGNOSIS — M542 Cervicalgia: Secondary | ICD-10-CM | POA: Diagnosis not present

## 2020-04-12 DIAGNOSIS — D2271 Melanocytic nevi of right lower limb, including hip: Secondary | ICD-10-CM | POA: Diagnosis not present

## 2020-04-12 DIAGNOSIS — D2261 Melanocytic nevi of right upper limb, including shoulder: Secondary | ICD-10-CM | POA: Diagnosis not present

## 2020-04-12 DIAGNOSIS — M25512 Pain in left shoulder: Secondary | ICD-10-CM | POA: Diagnosis not present

## 2020-04-12 DIAGNOSIS — Z85828 Personal history of other malignant neoplasm of skin: Secondary | ICD-10-CM | POA: Diagnosis not present

## 2020-04-12 DIAGNOSIS — M9901 Segmental and somatic dysfunction of cervical region: Secondary | ICD-10-CM | POA: Diagnosis not present

## 2020-04-12 DIAGNOSIS — D225 Melanocytic nevi of trunk: Secondary | ICD-10-CM | POA: Diagnosis not present

## 2020-04-12 DIAGNOSIS — R519 Headache, unspecified: Secondary | ICD-10-CM | POA: Diagnosis not present

## 2020-04-12 DIAGNOSIS — D2262 Melanocytic nevi of left upper limb, including shoulder: Secondary | ICD-10-CM | POA: Diagnosis not present

## 2020-04-14 DIAGNOSIS — R519 Headache, unspecified: Secondary | ICD-10-CM | POA: Diagnosis not present

## 2020-04-14 DIAGNOSIS — M25512 Pain in left shoulder: Secondary | ICD-10-CM | POA: Diagnosis not present

## 2020-04-14 DIAGNOSIS — M9901 Segmental and somatic dysfunction of cervical region: Secondary | ICD-10-CM | POA: Diagnosis not present

## 2020-04-14 DIAGNOSIS — M542 Cervicalgia: Secondary | ICD-10-CM | POA: Diagnosis not present

## 2020-05-06 DIAGNOSIS — H401132 Primary open-angle glaucoma, bilateral, moderate stage: Secondary | ICD-10-CM | POA: Diagnosis not present

## 2020-05-16 DIAGNOSIS — H353222 Exudative age-related macular degeneration, left eye, with inactive choroidal neovascularization: Secondary | ICD-10-CM | POA: Diagnosis not present

## 2020-05-19 DIAGNOSIS — G4752 REM sleep behavior disorder: Secondary | ICD-10-CM | POA: Diagnosis not present

## 2020-05-19 DIAGNOSIS — F0280 Dementia in other diseases classified elsewhere without behavioral disturbance: Secondary | ICD-10-CM | POA: Diagnosis not present

## 2020-05-19 DIAGNOSIS — G903 Multi-system degeneration of the autonomic nervous system: Secondary | ICD-10-CM | POA: Diagnosis not present

## 2020-05-19 DIAGNOSIS — G20 Parkinson's disease: Secondary | ICD-10-CM | POA: Diagnosis not present

## 2020-05-19 DIAGNOSIS — H548 Legal blindness, as defined in USA: Secondary | ICD-10-CM | POA: Diagnosis not present

## 2020-05-27 DIAGNOSIS — G20 Parkinson's disease: Secondary | ICD-10-CM | POA: Diagnosis not present

## 2020-05-27 DIAGNOSIS — U071 COVID-19: Secondary | ICD-10-CM | POA: Diagnosis not present

## 2020-05-27 DIAGNOSIS — J208 Acute bronchitis due to other specified organisms: Secondary | ICD-10-CM | POA: Diagnosis not present

## 2020-06-28 DIAGNOSIS — E0590 Thyrotoxicosis, unspecified without thyrotoxic crisis or storm: Secondary | ICD-10-CM | POA: Diagnosis not present

## 2020-06-28 DIAGNOSIS — E782 Mixed hyperlipidemia: Secondary | ICD-10-CM | POA: Diagnosis not present

## 2020-06-28 DIAGNOSIS — E538 Deficiency of other specified B group vitamins: Secondary | ICD-10-CM | POA: Diagnosis not present

## 2020-06-28 DIAGNOSIS — Z125 Encounter for screening for malignant neoplasm of prostate: Secondary | ICD-10-CM | POA: Diagnosis not present

## 2020-07-05 DIAGNOSIS — Z0000 Encounter for general adult medical examination without abnormal findings: Secondary | ICD-10-CM | POA: Diagnosis not present

## 2020-07-05 DIAGNOSIS — F0280 Dementia in other diseases classified elsewhere without behavioral disturbance: Secondary | ICD-10-CM | POA: Diagnosis not present

## 2020-07-05 DIAGNOSIS — G903 Multi-system degeneration of the autonomic nervous system: Secondary | ICD-10-CM | POA: Diagnosis not present

## 2020-07-05 DIAGNOSIS — G20 Parkinson's disease: Secondary | ICD-10-CM | POA: Diagnosis not present

## 2020-07-06 DIAGNOSIS — F0280 Dementia in other diseases classified elsewhere without behavioral disturbance: Secondary | ICD-10-CM | POA: Diagnosis not present

## 2020-07-06 DIAGNOSIS — G903 Multi-system degeneration of the autonomic nervous system: Secondary | ICD-10-CM | POA: Diagnosis not present

## 2020-07-06 DIAGNOSIS — G20 Parkinson's disease: Secondary | ICD-10-CM | POA: Diagnosis not present

## 2020-07-06 DIAGNOSIS — Z8616 Personal history of COVID-19: Secondary | ICD-10-CM | POA: Diagnosis not present

## 2020-09-06 DIAGNOSIS — Z8616 Personal history of COVID-19: Secondary | ICD-10-CM | POA: Diagnosis not present

## 2020-09-06 DIAGNOSIS — G20 Parkinson's disease: Secondary | ICD-10-CM | POA: Diagnosis not present

## 2020-09-06 DIAGNOSIS — G4752 REM sleep behavior disorder: Secondary | ICD-10-CM | POA: Diagnosis not present

## 2020-09-06 DIAGNOSIS — G4710 Hypersomnia, unspecified: Secondary | ICD-10-CM | POA: Diagnosis not present

## 2020-09-06 DIAGNOSIS — F0280 Dementia in other diseases classified elsewhere without behavioral disturbance: Secondary | ICD-10-CM | POA: Diagnosis not present

## 2020-09-06 DIAGNOSIS — G903 Multi-system degeneration of the autonomic nervous system: Secondary | ICD-10-CM | POA: Diagnosis not present

## 2020-09-13 DIAGNOSIS — C44311 Basal cell carcinoma of skin of nose: Secondary | ICD-10-CM | POA: Diagnosis not present

## 2020-09-13 DIAGNOSIS — D485 Neoplasm of uncertain behavior of skin: Secondary | ICD-10-CM | POA: Diagnosis not present

## 2020-09-22 DIAGNOSIS — E0590 Thyrotoxicosis, unspecified without thyrotoxic crisis or storm: Secondary | ICD-10-CM | POA: Diagnosis not present

## 2020-10-10 DIAGNOSIS — E538 Deficiency of other specified B group vitamins: Secondary | ICD-10-CM | POA: Diagnosis not present

## 2020-10-10 DIAGNOSIS — E782 Mixed hyperlipidemia: Secondary | ICD-10-CM | POA: Diagnosis not present

## 2020-10-10 DIAGNOSIS — G20 Parkinson's disease: Secondary | ICD-10-CM | POA: Diagnosis not present

## 2020-10-10 DIAGNOSIS — Z23 Encounter for immunization: Secondary | ICD-10-CM | POA: Diagnosis not present

## 2020-10-10 DIAGNOSIS — G903 Multi-system degeneration of the autonomic nervous system: Secondary | ICD-10-CM | POA: Diagnosis not present

## 2020-10-10 DIAGNOSIS — M4727 Other spondylosis with radiculopathy, lumbosacral region: Secondary | ICD-10-CM | POA: Diagnosis not present

## 2020-10-11 DIAGNOSIS — H168 Other keratitis: Secondary | ICD-10-CM | POA: Diagnosis not present

## 2020-10-14 DIAGNOSIS — D485 Neoplasm of uncertain behavior of skin: Secondary | ICD-10-CM | POA: Diagnosis not present

## 2020-10-17 DIAGNOSIS — L570 Actinic keratosis: Secondary | ICD-10-CM | POA: Diagnosis not present

## 2020-10-31 DIAGNOSIS — H401132 Primary open-angle glaucoma, bilateral, moderate stage: Secondary | ICD-10-CM | POA: Diagnosis not present

## 2020-11-07 DIAGNOSIS — H353212 Exudative age-related macular degeneration, right eye, with inactive choroidal neovascularization: Secondary | ICD-10-CM | POA: Diagnosis not present

## 2020-11-10 DIAGNOSIS — C44311 Basal cell carcinoma of skin of nose: Secondary | ICD-10-CM | POA: Diagnosis not present

## 2020-12-05 DIAGNOSIS — F0280 Dementia in other diseases classified elsewhere without behavioral disturbance: Secondary | ICD-10-CM | POA: Diagnosis not present

## 2020-12-05 DIAGNOSIS — G4752 REM sleep behavior disorder: Secondary | ICD-10-CM | POA: Diagnosis not present

## 2020-12-05 DIAGNOSIS — G4710 Hypersomnia, unspecified: Secondary | ICD-10-CM | POA: Diagnosis not present

## 2020-12-05 DIAGNOSIS — G903 Multi-system degeneration of the autonomic nervous system: Secondary | ICD-10-CM | POA: Diagnosis not present

## 2020-12-05 DIAGNOSIS — G20 Parkinson's disease: Secondary | ICD-10-CM | POA: Diagnosis not present

## 2020-12-14 DIAGNOSIS — D2272 Melanocytic nevi of left lower limb, including hip: Secondary | ICD-10-CM | POA: Diagnosis not present

## 2020-12-14 DIAGNOSIS — Z85828 Personal history of other malignant neoplasm of skin: Secondary | ICD-10-CM | POA: Diagnosis not present

## 2020-12-14 DIAGNOSIS — D2262 Melanocytic nevi of left upper limb, including shoulder: Secondary | ICD-10-CM | POA: Diagnosis not present

## 2020-12-14 DIAGNOSIS — D2261 Melanocytic nevi of right upper limb, including shoulder: Secondary | ICD-10-CM | POA: Diagnosis not present

## 2020-12-14 DIAGNOSIS — L570 Actinic keratosis: Secondary | ICD-10-CM | POA: Diagnosis not present

## 2020-12-14 DIAGNOSIS — X32XXXA Exposure to sunlight, initial encounter: Secondary | ICD-10-CM | POA: Diagnosis not present

## 2020-12-14 DIAGNOSIS — L821 Other seborrheic keratosis: Secondary | ICD-10-CM | POA: Diagnosis not present

## 2020-12-28 ENCOUNTER — Emergency Department: Payer: PPO

## 2020-12-28 ENCOUNTER — Observation Stay: Payer: PPO

## 2020-12-28 ENCOUNTER — Other Ambulatory Visit: Payer: Self-pay

## 2020-12-28 ENCOUNTER — Encounter: Payer: Self-pay | Admitting: *Deleted

## 2020-12-28 ENCOUNTER — Inpatient Hospital Stay
Admission: EM | Admit: 2020-12-28 | Discharge: 2021-01-06 | DRG: 193 | Disposition: A | Discharge status: Home / Self Care | Payer: PPO | Attending: Internal Medicine | Admitting: Internal Medicine

## 2020-12-28 DIAGNOSIS — Z66 Do not resuscitate: Secondary | ICD-10-CM | POA: Diagnosis not present

## 2020-12-28 DIAGNOSIS — H409 Unspecified glaucoma: Secondary | ICD-10-CM | POA: Diagnosis present

## 2020-12-28 DIAGNOSIS — Z79890 Hormone replacement therapy (postmenopausal): Secondary | ICD-10-CM | POA: Diagnosis not present

## 2020-12-28 DIAGNOSIS — R296 Repeated falls: Secondary | ICD-10-CM | POA: Diagnosis present

## 2020-12-28 DIAGNOSIS — R269 Unspecified abnormalities of gait and mobility: Secondary | ICD-10-CM | POA: Diagnosis present

## 2020-12-28 DIAGNOSIS — J101 Influenza due to other identified influenza virus with other respiratory manifestations: Principal | ICD-10-CM | POA: Diagnosis present

## 2020-12-28 DIAGNOSIS — R410 Disorientation, unspecified: Secondary | ICD-10-CM | POA: Diagnosis not present

## 2020-12-28 DIAGNOSIS — Z8616 Personal history of COVID-19: Secondary | ICD-10-CM

## 2020-12-28 DIAGNOSIS — F05 Delirium due to known physiological condition: Secondary | ICD-10-CM | POA: Diagnosis not present

## 2020-12-28 DIAGNOSIS — N319 Neuromuscular dysfunction of bladder, unspecified: Secondary | ICD-10-CM | POA: Diagnosis not present

## 2020-12-28 DIAGNOSIS — Y92009 Unspecified place in unspecified non-institutional (private) residence as the place of occurrence of the external cause: Secondary | ICD-10-CM | POA: Diagnosis not present

## 2020-12-28 DIAGNOSIS — E8809 Other disorders of plasma-protein metabolism, not elsewhere classified: Secondary | ICD-10-CM | POA: Diagnosis not present

## 2020-12-28 DIAGNOSIS — M545 Low back pain, unspecified: Secondary | ICD-10-CM | POA: Diagnosis not present

## 2020-12-28 DIAGNOSIS — K219 Gastro-esophageal reflux disease without esophagitis: Secondary | ICD-10-CM | POA: Diagnosis present

## 2020-12-28 DIAGNOSIS — R404 Transient alteration of awareness: Secondary | ICD-10-CM | POA: Diagnosis not present

## 2020-12-28 DIAGNOSIS — Z20822 Contact with and (suspected) exposure to covid-19: Secondary | ICD-10-CM | POA: Diagnosis not present

## 2020-12-28 DIAGNOSIS — K5909 Other constipation: Secondary | ICD-10-CM | POA: Diagnosis present

## 2020-12-28 DIAGNOSIS — Z79899 Other long term (current) drug therapy: Secondary | ICD-10-CM | POA: Diagnosis not present

## 2020-12-28 DIAGNOSIS — W19XXXA Unspecified fall, initial encounter: Secondary | ICD-10-CM | POA: Diagnosis present

## 2020-12-28 DIAGNOSIS — M47812 Spondylosis without myelopathy or radiculopathy, cervical region: Secondary | ICD-10-CM | POA: Diagnosis not present

## 2020-12-28 DIAGNOSIS — R4182 Altered mental status, unspecified: Secondary | ICD-10-CM | POA: Diagnosis present

## 2020-12-28 DIAGNOSIS — G9340 Encephalopathy, unspecified: Secondary | ICD-10-CM

## 2020-12-28 DIAGNOSIS — Z87891 Personal history of nicotine dependence: Secondary | ICD-10-CM

## 2020-12-28 DIAGNOSIS — R4781 Slurred speech: Secondary | ICD-10-CM | POA: Diagnosis not present

## 2020-12-28 DIAGNOSIS — G20 Parkinson's disease: Secondary | ICD-10-CM | POA: Diagnosis not present

## 2020-12-28 DIAGNOSIS — F02818 Dementia in other diseases classified elsewhere, unspecified severity, with other behavioral disturbance: Secondary | ICD-10-CM | POA: Diagnosis not present

## 2020-12-28 DIAGNOSIS — G4700 Insomnia, unspecified: Secondary | ICD-10-CM | POA: Diagnosis present

## 2020-12-28 DIAGNOSIS — G903 Multi-system degeneration of the autonomic nervous system: Secondary | ICD-10-CM | POA: Diagnosis not present

## 2020-12-28 DIAGNOSIS — F0280 Dementia in other diseases classified elsewhere without behavioral disturbance: Secondary | ICD-10-CM | POA: Diagnosis not present

## 2020-12-28 DIAGNOSIS — D696 Thrombocytopenia, unspecified: Secondary | ICD-10-CM | POA: Diagnosis not present

## 2020-12-28 DIAGNOSIS — E781 Pure hyperglyceridemia: Secondary | ICD-10-CM | POA: Diagnosis not present

## 2020-12-28 DIAGNOSIS — Z85828 Personal history of other malignant neoplasm of skin: Secondary | ICD-10-CM | POA: Diagnosis not present

## 2020-12-28 DIAGNOSIS — G9341 Metabolic encephalopathy: Secondary | ICD-10-CM | POA: Diagnosis not present

## 2020-12-28 DIAGNOSIS — U071 COVID-19: Secondary | ICD-10-CM | POA: Diagnosis not present

## 2020-12-28 DIAGNOSIS — R0602 Shortness of breath: Secondary | ICD-10-CM | POA: Diagnosis not present

## 2020-12-28 DIAGNOSIS — Z8546 Personal history of malignant neoplasm of prostate: Secondary | ICD-10-CM | POA: Diagnosis not present

## 2020-12-28 DIAGNOSIS — R0902 Hypoxemia: Secondary | ICD-10-CM | POA: Diagnosis not present

## 2020-12-28 DIAGNOSIS — S0990XA Unspecified injury of head, initial encounter: Secondary | ICD-10-CM | POA: Diagnosis not present

## 2020-12-28 DIAGNOSIS — R531 Weakness: Secondary | ICD-10-CM | POA: Diagnosis not present

## 2020-12-28 DIAGNOSIS — Z87442 Personal history of urinary calculi: Secondary | ICD-10-CM

## 2020-12-28 DIAGNOSIS — N400 Enlarged prostate without lower urinary tract symptoms: Secondary | ICD-10-CM | POA: Diagnosis present

## 2020-12-28 DIAGNOSIS — G319 Degenerative disease of nervous system, unspecified: Secondary | ICD-10-CM | POA: Diagnosis not present

## 2020-12-28 DIAGNOSIS — I672 Cerebral atherosclerosis: Secondary | ICD-10-CM | POA: Diagnosis not present

## 2020-12-28 LAB — COMPREHENSIVE METABOLIC PANEL
ALT: <5 U/L (ref 0–44)
AST: 20 U/L (ref 15–41)
Albumin: 3.4 g/dL — ABNORMAL LOW (ref 3.5–5.0)
Alkaline Phosphatase: 92 U/L (ref 38–126)
Anion gap: 6 (ref 5–15)
BUN: 14 mg/dL (ref 8–23)
CO2: 25 mmol/L (ref 22–32)
Calcium: 7.8 mg/dL — ABNORMAL LOW (ref 8.9–10.3)
Chloride: 105 mmol/L (ref 98–111)
Creatinine, Ser: 0.79 mg/dL (ref 0.61–1.24)
GFR, Estimated: >60 mL/min (ref >60)
Glucose, Bld: 108 mg/dL — ABNORMAL HIGH (ref 70–99)
Potassium: 3.6 mmol/L (ref 3.5–5.1)
Sodium: 136 mmol/L (ref 135–145)
Total Bilirubin: 0.9 mg/dL (ref 0.3–1.2)
Total Protein: 6.1 g/dL — ABNORMAL LOW (ref 6.5–8.1)

## 2020-12-28 LAB — CBC
HCT: 39.4 % (ref 39.0–52.0)
Hemoglobin: 13.2 g/dL (ref 13.0–17.0)
MCH: 31.7 pg (ref 26.0–34.0)
MCHC: 33.5 g/dL (ref 30.0–36.0)
MCV: 94.7 fL (ref 80.0–100.0)
Platelets: 129 10*3/uL — ABNORMAL LOW (ref 150–400)
RBC: 4.16 MIL/uL — ABNORMAL LOW (ref 4.22–5.81)
RDW: 13.3 % (ref 11.5–15.5)
WBC: 6.3 10*3/uL (ref 4.0–10.5)
nRBC: 0 % (ref 0.0–0.2)

## 2020-12-28 LAB — URINALYSIS, COMPLETE (UACMP) WITH MICROSCOPIC
Bacteria, UA: NONE SEEN
Bilirubin Urine: NEGATIVE
Glucose, UA: NEGATIVE mg/dL
Hgb urine dipstick: NEGATIVE
Ketones, ur: 40 mg/dL — AB
Leukocytes,Ua: NEGATIVE
Nitrite: NEGATIVE
Protein, ur: NEGATIVE mg/dL
Specific Gravity, Urine: >1.03 — ABNORMAL HIGH (ref 1.005–1.030)
Squamous Epithelial / HPF: NONE SEEN (ref 0–5)
pH: 6 (ref 5.0–8.0)

## 2020-12-28 LAB — RESP PANEL BY RT-PCR (FLU A&B, COVID) ARPGX2
Influenza A by PCR: POSITIVE — AB
Influenza B by PCR: NEGATIVE
SARS Coronavirus 2 by RT PCR: NEGATIVE

## 2020-12-28 LAB — LACTIC ACID, PLASMA: Lactic Acid, Venous: 1.7 mmol/L (ref 0.5–1.9)

## 2020-12-28 MED ORDER — TIMOLOL MALEATE 0.5 % OP SOLN
1.0000 [drp] | Freq: Every day | OPHTHALMIC | Status: DC
  Administered 2020-12-31 – 2021-01-06 (×7): 1 [drp] via OPHTHALMIC
  Filled 2020-12-28 (×2): qty 5

## 2020-12-28 MED ORDER — OSELTAMIVIR PHOSPHATE 75 MG PO CAPS
75.0000 mg | ORAL_CAPSULE | Freq: Once | ORAL | Status: AC
  Administered 2020-12-28: 75 mg via ORAL
  Filled 2020-12-28: qty 1

## 2020-12-28 MED ORDER — PRESERVISION AREDS 2 PO CAPS: ORAL_CAPSULE | Freq: Every day | ORAL | Status: DC

## 2020-12-28 MED ORDER — CLONAZEPAM 0.5 MG PO TABS
0.5000 mg | ORAL_TABLET | Freq: Two times a day (BID) | ORAL | Status: DC | PRN
  Administered 2020-12-28 – 2021-01-06 (×5): 0.5 mg via ORAL
  Filled 2020-12-28 (×6): qty 1

## 2020-12-28 MED ORDER — CARBIDOPA-LEVODOPA 25-100 MG PO TABS
2.0000 | ORAL_TABLET | Freq: Four times a day (QID) | ORAL | Status: DC
  Administered 2020-12-28 – 2021-01-06 (×33): 2 via ORAL
  Filled 2020-12-28 (×38): qty 2

## 2020-12-28 MED ORDER — TAMSULOSIN HCL 0.4 MG PO CAPS: 0.4000 mg | ORAL_CAPSULE | Freq: Every day | ORAL | Status: DC | PRN

## 2020-12-28 MED ORDER — KETOROLAC TROMETHAMINE 30 MG/ML IJ SOLN
15.0000 mg | Freq: Three times a day (TID) | INTRAMUSCULAR | Status: DC | PRN
  Administered 2020-12-29: 04:00:00 15 mg via INTRAVENOUS
  Filled 2020-12-28: qty 1

## 2020-12-28 MED ORDER — POLYVINYL ALCOHOL 1.4 % OP SOLN
1.0000 [drp] | OPHTHALMIC | Status: DC | PRN
  Filled 2020-12-28: qty 15

## 2020-12-28 MED ORDER — GUAIFENESIN-DM 100-10 MG/5ML PO SYRP
5.0000 mL | ORAL_SOLUTION | ORAL | Status: DC | PRN
  Administered 2020-12-30: 5 mL via ORAL
  Filled 2020-12-28: qty 5

## 2020-12-28 MED ORDER — ACETAMINOPHEN 325 MG PO TABS
650.0000 mg | ORAL_TABLET | Freq: Four times a day (QID) | ORAL | Status: DC | PRN
  Administered 2020-12-30: 10:00:00 650 mg via ORAL
  Filled 2020-12-28: qty 2

## 2020-12-28 MED ORDER — FLUDROCORTISONE ACETATE 0.1 MG PO TABS
0.1000 mg | ORAL_TABLET | Freq: Two times a day (BID) | ORAL | Status: DC
  Administered 2020-12-28 – 2021-01-06 (×18): 0.1 mg via ORAL
  Filled 2020-12-28 (×20): qty 1

## 2020-12-28 MED ORDER — PSYLLIUM 95 % PO PACK
1.0000 | PACK | Freq: Every day | ORAL | Status: DC
  Administered 2020-12-30 – 2021-01-06 (×6): 1 via ORAL
  Filled 2020-12-28 (×9): qty 1

## 2020-12-28 MED ORDER — POLYETHYL GLYCOL-PROPYL GLYCOL 0.4-0.3 % OP GEL: Freq: Three times a day (TID) | OPHTHALMIC | Status: DC | PRN

## 2020-12-28 MED ORDER — OSELTAMIVIR PHOSPHATE 75 MG PO CAPS
75.0000 mg | ORAL_CAPSULE | Freq: Two times a day (BID) | ORAL | Status: AC
  Administered 2020-12-28 – 2021-01-02 (×10): 75 mg via ORAL
  Filled 2020-12-28 (×11): qty 1

## 2020-12-28 MED ORDER — ARTIFICIAL TEARS OPHTHALMIC OINT
TOPICAL_OINTMENT | Freq: Three times a day (TID) | OPHTHALMIC | Status: DC | PRN
  Filled 2020-12-28 (×2): qty 3.5

## 2020-12-28 MED ORDER — ENOXAPARIN SODIUM 40 MG/0.4ML IJ SOSY: 40.0000 mg | PREFILLED_SYRINGE | INTRAMUSCULAR | Status: DC

## 2020-12-28 MED ORDER — VITAMIN D3 25 MCG (1000 UNIT) PO TABS
1000.0000 [IU] | ORAL_TABLET | Freq: Every day | ORAL | Status: DC
  Administered 2020-12-29 – 2021-01-06 (×9): 1000 [IU] via ORAL
  Filled 2020-12-28 (×15): qty 1

## 2020-12-28 MED ORDER — CARBIDOPA-LEVODOPA ER 50-200 MG PO TBCR
1.0000 | EXTENDED_RELEASE_TABLET | Freq: Every day | ORAL | Status: DC
  Administered 2020-12-28 – 2021-01-05 (×9): 1 via ORAL
  Filled 2020-12-28 (×11): qty 1

## 2020-12-28 MED ORDER — SODIUM CHLORIDE 0.9 % IV SOLN: Freq: Once | INTRAVENOUS | Status: DC

## 2020-12-28 MED ORDER — MELATONIN 5 MG PO TABS
10.0000 mg | ORAL_TABLET | Freq: Every day | ORAL | Status: DC
  Administered 2020-12-28 – 2021-01-05 (×9): 10 mg via ORAL
  Filled 2020-12-28 (×10): qty 2

## 2020-12-28 MED ORDER — MIDODRINE HCL 5 MG PO TABS
2.5000 mg | ORAL_TABLET | Freq: Three times a day (TID) | ORAL | Status: DC
  Administered 2020-12-29: 2.5 mg via ORAL
  Filled 2020-12-28: qty 1

## 2020-12-28 MED ORDER — TIMOLOL HEMIHYDRATE 0.5 % OP SOLN: 1.0000 [drp] | Freq: Every day | OPHTHALMIC | Status: DC

## 2020-12-28 MED ORDER — NIACIN 500 MG PO TABS
1000.0000 mg | ORAL_TABLET | Freq: Every day | ORAL | Status: DC
  Administered 2020-12-30: 10:00:00 1000 mg via ORAL
  Filled 2020-12-28 (×2): qty 2

## 2020-12-28 MED ORDER — HALOPERIDOL LACTATE 5 MG/ML IJ SOLN
2.5000 mg | Freq: Once | INTRAMUSCULAR | Status: AC
  Administered 2020-12-28: 02:00:00 2.5 mg via INTRAVENOUS
  Filled 2020-12-28 (×2): qty 1

## 2020-12-28 MED ORDER — OCUVITE-LUTEIN PO CAPS
1.0000 | ORAL_CAPSULE | Freq: Two times a day (BID) | ORAL | Status: DC
  Administered 2020-12-29 – 2021-01-06 (×17): 1 via ORAL
  Filled 2020-12-28 (×18): qty 1

## 2020-12-28 NOTE — ED Notes (Signed)
Message sent to pharmacy to verify medications 

## 2020-12-28 NOTE — ED Notes (Signed)
NP at the bedside

## 2020-12-28 NOTE — H&P (Addendum)
History and Physical    Steven Buckley UQJ:[id number] DOB: [birth year] DOA: 12/28/2020  PCP: Rusty Aus, MD   Chief Complaint: Altered mental status  HPI: Steven Buckley is a 80 y.o. male with medical history significant of Parkinson's disease with dementia, orthostatic hypotension, urinary urgency due to neurogenic bladder, laryngopharyngeal reflux (LPR), lumbosacral spondylosis with radiculopathy presented to the ED via EMS due to altered mental status and a fall.  Wife at bedside during encounter reported patient tested positive for COVID-19 on Monday (2 days ago), received an infusion on Tuesday (unable to find record of this on chart review) and prescribed Paxlovid, Z-Pak by PCP.  She reports patient had increasing confusion last night, overnight was very restless and unable to sleep, was up and down to use the bathroom but had difficulty finding the bathroom.  Then around 3:30 AM had a fall in between their 2 twin beds and was unable to get up.  Wife reported at that time he was very minimally responsive, could not move and she thought he had had a stroke.  EMS was called to bring patient to the hospital for further evaluation  In the ED, patient had stable vital signs and not hypoxic.  CBC showed mild thrombocytopenia with platelet count 129.  CMP essentially normal with mild hypocalcemia along with hypoalbuminemia and glucose 108.  Lactic acid was normal.  UA concentrated but without signs of infection.  COVID-19 PCR negative.  Influenza A positive, influenza B negative.  CT head and subsequent MRI brain without acute abnormalities.  CT of the cervical spine was significantly motion degraded but without any signs of fractures.  Lumbar x-ray showed old T12 compression fracture with T12-11 fusion but no acute abnormalities.  Chest x-ray without signs of pneumonia, poor inspiration.  During admission encounter, SPO2 noted to fluctuate, intermittently dropping into the upper 80s on room  air.  Patient started on Tamiflu, admitted for observation and further evaluation and management.   Review of Systems: ROS   As per HPI otherwise 10 point review of systems negative.   No Known Allergies  Past Medical History:  Diagnosis Date   Anxiety    Arthritis    osteoarthritis   BPH (benign prostatic hyperplasia)    Cancer ([place]) 2007   prostate, treated with seeds. skin cancer also   Dementia ([place])    Dizziness 04/2017   looses balance frequently but no falls   GERD (gastroesophageal reflux disease)    Glaucoma    bilateral   History of kidney stones    Hypertriglyceridemia    Macular degeneration    Neurogenic orthostatic hypotension (HCC)    Orthostatic hypotension    Parkinson's disease ([place])     Past Surgical History:  Procedure Laterality Date   COLONOSCOPY WITH PROPOFOL N/A 07/14/2018   Procedure: COLONOSCOPY WITH PROPOFOL;  Surgeon: Manya Silvas, MD;  Location: [facility] ENDOSCOPY;  Service: Endoscopy;  Laterality: N/A;   EYE SURGERY Bilateral    cataract extractions   HAND SURGERY Right    crushed hand in hay baler   HERNIA REPAIR Left 1949   inguinal repair   INGUINAL HERNIA REPAIR Right 05/22/2017   Procedure: HERNIA REPAIR INGUINAL ADULT;  Surgeon: Herbert Pun, MD;  Location: [facility] ORS;  Service: General;  Laterality: Right;   SHOULDER ARTHROSCOPY Right 1999   rotator cuff repair   skin cancer removal  2018   squamos cell on nose, basal cell on cheek, back   TONSILLECTOMY  reports that he quit smoking about 53 years ago. His smoking use included cigarettes. He smoked an average of 1 pack per day. He has quit using smokeless tobacco. He reports current alcohol use of about 1.0 standard drink per week. He reports that he does not use drugs.  History reviewed. No pertinent family history.  Prior to Admission medications   Medication Sig Start Date End Date Taking? Authorizing Provider  Bevacizumab (AVASTIN IV) Inject 1 Dose into the  eye every 6 (six) weeks. Injection into both eyes every 6 to 11 weeks as indicated for retinal bleeding    [provider]  carbidopa-levodopa (SINEMET CR) 50-200 MG tablet Take 1 tablet by mouth at bedtime.    [provider]  carbidopa-levodopa (SINEMET IR) 25-100 MG tablet Take 2 tablets by mouth 4 (four) times daily.    [provider]  Cholecalciferol (VITAMIN D3) 1000 units CAPS Take 1,000 Units by mouth daily.    [provider]  clonazePAM (KLONOPIN) 0.5 MG tablet Take 0.5 mg by mouth 2 (two) times daily as needed for anxiety.    [provider]  fludrocortisone (FLORINEF) 0.1 MG tablet Take 0.1 mg by mouth 2 (two) times daily. Given for hypotension related to parkinsons    [provider]  ibuprofen (ADVIL,MOTRIN) 200 MG tablet Take 400 mg by mouth every 6 (six) hours as needed for headache or moderate pain.    [provider]  Inositol Niacinate (NIACIN FLUSH FREE PO) Take 1 tablet by mouth daily.    [provider]  ketoconazole (NIZORAL) 2 % shampoo Apply 1 application topically every 14 (fourteen) days. 04/22/17   [provider]  Melatonin 10 MG TABS Take by mouth.    [provider]  midodrine (PROAMATINE) 2.5 MG tablet Take 2.5 mg by mouth 3 (three) times daily with meals.    [provider]  Multiple Vitamins-Minerals (PRESERVISION AREDS 2 PO) Take 1 capsule by mouth daily.    [provider]  niacin 500 MG tablet Take 1,000 mg by mouth daily.    [provider]  Nutritional Supplements (JUICE PLUS FIBRE PO) Take 6 capsules by mouth daily.    [provider]  Polyethyl Glycol-Propyl Glycol (SYSTANE OP) Place 2 drops into both eyes 3 (three) times daily as needed (for dry eyes).    [provider]  silodosin (RAPAFLO) 8 MG CAPS capsule Take 8 mg by mouth daily as needed (for bladder). TITRATES DOSE AS NEEDED AND USUALLY TAKES ONLY 1 OF EACH RAPAFLO AND  FLOMAX    [provider]  tamsulosin (FLOMAX) 0.4 MG CAPS capsule Take 0.4 mg by mouth daily as needed (for bladder).    [provider]  timolol (BETIMOL) 0.5 % ophthalmic solution Place 1 drop into both eyes daily.    [provider]  vitamin B-12 (CYANOCOBALAMIN) 500 MCG tablet Take 500 mcg by mouth daily. TAKES SUBLINGUILLY    [provider]    Physical Exam: Vitals:   12/28/20 0841 12/28/20 1010  BP: 122/79 123/71  Pulse: 89 (!) 54  Resp:  18  Temp: 98 F (36.7 C)   TempSrc: Oral   SpO2: 97% 98%   Physical Exam  General: Awake and alert, no acute distress HEENT: EOMI, mild conjunctival injection bilaterally, atraumatic, hearing grossly normal Respiratory: Lungs clear bilaterally, no wheezes rales or rhonchi heard, normal respiratory effort on room air Cardiovascular: Regular rate and rhythm, S1/S2 present, no peripheral edema Gastrointestinal: Abdomen soft and  nontender with bowel sounds present Neurologic: Alert and oriented x3, Cranial nerves grossly intact, 5/5 symmetric grip strength, 5/5 lower extremity motor with dorsi and plantar flexion Extremities: Moves all, normal tone, no cyanosis, no gross deformities Psychiatric: Normal mood, congruent affect, judgment and insight appear normal    Labs on Admission: I have personally reviewed the patients's labs and imaging studies.  Assessment/Plan Principal Problem:   Altered mental status Active Problems:   Influenza A   Dementia 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 (HCC)   Neurogenic orthostatic hypotension (HCC)    Altered mental status -wife described as significantly reduced responsiveness and inability to move after a fall overnight.  Wife suspected stroke.  Noncontrast head CT and brain MRI negative for acute findings.  Neuro exam is nonfocal.  Given history of orthostatic hypotension, suspect cerebral hypoperfusion was the cause.  During admission  encounter, wife reports patient much improved and near baseline. --Delirium precautions --Neurochecks --Treat underlying causes as below  Influenza A infection -without signs of pneumonia on chest x-ray.  Not hypoxic on admission but SPO2 on the monitor during admission encounter would fluctuate and intermittently dropped to the upper 80s. --Tamiflu --Supplement O2 if needed to maintain sats above 90% --Supportive care with antitussives, Tylenol  Fall -most likely secondary to generalized weakness in the setting of above infection, orthostasis may have contributed as well.  Patient typically independent with ambulation, no assist device.  Has occasional falls at home per family, due to gait abnormality related to Parkinson's. --Fall precautions --PT OT evaluations pending  Neurogenic orthostatic hypotension -chronic, secondary to Parkinson's disease. --Continue home Florinef and midodrine  Chronic constipation -secondary Parkinson's disease. --Continue with daily Metamucil  Dementia due to Parkinson's disease -patient follows with neurologist, Dr. Manuella Ghazi. -- Continue home Sinemet IR 4 times daily and Sinemet CR at bedtime --Continue home Klonopin as needed  Insomnia -chronic per chart review --Melatonin at bedtime  Urinary urgency due to neurogenic bladder -related to Parkinson's. --Continue Flomax as needed    Code Status: DNR   DVT Prophylaxis:   enoxaparin (LOVENOX) injection 40 mg Start: 12/28/20 1400   Family Communication: Wife and son at bedside during admission encounter Admission status: Observation Telemetry Medical  Certification: The appropriate patient status for this patient is OBSERVATION. Observation status is judged to be reasonable and necessary in order to provide the required intensity of service to ensure the patient's safety. The patient's presenting symptoms, physical exam findings, and initial radiographic and laboratory data in the context of their medical  condition is felt to place them at decreased risk for further clinical deterioration. Furthermore, it is anticipated that the patient will be medically stable for discharge from the hospital within 2 midnights of admission.     Ezekiel Slocumb MD [facility] If 7PM-7AM, please contact night-coverage [email or web address]  12/28/2020, 2:44 PM

## 2020-12-28 NOTE — ED Notes (Signed)
Report received from Maggie, RN

## 2020-12-28 NOTE — ED Notes (Signed)
Warm blanket appled. Gone to CT scan.

## 2020-12-28 NOTE — ED Notes (Signed)
Son remains at the bedside, awaiting transport to take patient to his room.

## 2020-12-28 NOTE — ED Provider Notes (Signed)
[facility] Emergency Department Provider Note    Event Date/Time   First MD Initiated Contact with Patient 12/28/20 [phone]     (approximate)  I have reviewed the triage vital signs and the nursing notes.   HISTORY  Chief Complaint Fall (Wife said he fell at 20 a in bathroom. Wife stated came to bed when she tried to get him up this morning thought he had garble speech. Patient communicating well with EMS and is currently talking clearly)    HPI KEMONTE ULLMAN III is a 80 y.o. male with extensive past medical history including Parkinson's disease with dementia with recent diagnosis of COVID placed on Paxil Oved as an outpatient receiving 2 doses presents to the ER for altered mental status and fall last night around 330.  Wife states that he seemed confused around 330 fell did not call EMS at that time but this morning patient was increasingly confused not getting out of bed.  Has been complaining of some shortness of breath.  Last seen normal was yesterday evening around dinnertime.  Not on any blood thinners.  Past Medical History:  Diagnosis Date   Anxiety    Arthritis    osteoarthritis   BPH (benign prostatic hyperplasia)    Cancer ([place]) 2007   prostate, treated with seeds. skin cancer also   Dementia ([place])    Dizziness 04/2017   looses balance frequently but no falls   GERD (gastroesophageal reflux disease)    Glaucoma    bilateral   History of kidney stones    Hypertriglyceridemia    Macular degeneration    Neurogenic orthostatic hypotension (H[facility])    Orthostatic hypotension    Parkinson's disease ([place])    History reviewed. No pertinent family history. Past Surgical History:  Procedure Laterality Date   COLONOSCOPY WITH PROPOFOL N/A 07/14/2018   Procedure: COLONOSCOPY WITH PROPOFOL;  Surgeon: Manya Silvas, MD;  Location: [facility] ENDOSCOPY;  Service: Endoscopy;  Laterality: N/A;   EYE SURGERY Bilateral    cataract extractions   HAND SURGERY  Right    crushed hand in hay baler   HERNIA REPAIR Left 1949   inguinal repair   INGUINAL HERNIA REPAIR Right 05/22/2017   Procedure: HERNIA REPAIR INGUINAL ADULT;  Surgeon: Herbert Pun, MD;  Location: [facility] ORS;  Service: General;  Laterality: Right;   SHOULDER ARTHROSCOPY Right 1999   rotator cuff repair   skin cancer removal  2018   squamos cell on nose, basal cell on cheek, back   TONSILLECTOMY     Patient Active Problem List   Diagnosis Date Noted   Altered mental status 12/28/2020      Prior to Admission medications   Medication Sig Start Date End Date Taking? Authorizing Provider  Bevacizumab (AVASTIN IV) Inject 1 Dose into the eye every 6 (six) weeks. Injection into both eyes every 6 to 11 weeks as indicated for retinal bleeding    [provider]  carbidopa-levodopa (SINEMET CR) 50-200 MG tablet Take 1 tablet by mouth at bedtime.    [provider]  carbidopa-levodopa (SINEMET IR) 25-100 MG tablet Take 2 tablets by mouth 4 (four) times daily.    [provider]  Cholecalciferol (VITAMIN D3) 1000 units CAPS Take 1,000 Units by mouth daily.    [provider]  clonazePAM (KLONOPIN) 0.5 MG tablet Take 0.5 mg by mouth 2 (two) times daily as needed for anxiety.    [provider]  fludrocortisone (FLORINEF) 0.1 MG tablet Take  0.1 mg by mouth 2 (two) times daily. Given for hypotension related to parkinsons    [provider]  ibuprofen (ADVIL,MOTRIN) 200 MG tablet Take 400 mg by mouth every 6 (six) hours as needed for headache or moderate pain.    [provider]  Inositol Niacinate (NIACIN FLUSH FREE PO) Take 1 tablet by mouth daily.    [provider]  ketoconazole (NIZORAL) 2 % shampoo Apply 1 application topically every 14 (fourteen) days. 04/22/17   [provider]  Melatonin 10 MG TABS Take by mouth.    [provider]  midodrine (PROAMATINE) 2.5 MG tablet Take 2.5 mg by mouth 3  (three) times daily with meals.    [provider]  Multiple Vitamins-Minerals (PRESERVISION AREDS 2 PO) Take 1 capsule by mouth daily.    [provider]  niacin 500 MG tablet Take 1,000 mg by mouth daily.    [provider]  Nutritional Supplements (JUICE PLUS FIBRE PO) Take 6 capsules by mouth daily.    [provider]  Polyethyl Glycol-Propyl Glycol (SYSTANE OP) Place 2 drops into both eyes 3 (three) times daily as needed (for dry eyes).    [provider]  silodosin (RAPAFLO) 8 MG CAPS capsule Take 8 mg by mouth daily as needed (for bladder). TITRATES DOSE AS NEEDED AND USUALLY TAKES ONLY 1 OF EACH RAPAFLO AND FLOMAX    [provider]  tamsulosin (FLOMAX) 0.4 MG CAPS capsule Take 0.4 mg by mouth daily as needed (for bladder).    [provider]  timolol (BETIMOL) 0.5 % ophthalmic solution Place 1 drop into both eyes daily.    [provider]  vitamin B-12 (CYANOCOBALAMIN) 500 MCG tablet Take 500 mcg by mouth daily. TAKES SUBLINGUILLY    [provider]    Allergies Patient has no known allergies.    Social History Social History   Tobacco Use   Smoking status: Former    Packs/day: 1.00    Types: Cigarettes    Quit date: 01/30/1967    Years since quitting: 53.9   Smokeless tobacco: Former   Tobacco comments:    tried smokeless tobacco but did not like it  Vaping Use   Vaping Use: Never used  Substance Use Topics   Alcohol use: Yes    Alcohol/week: 1.0 standard drink    Types: 1 Glasses of wine per week    Comment: once a week, maybe   Drug use: Never    Review of Systems Patient denies headaches, rhinorrhea, blurry vision, numbness, shortness of breath, chest pain, edema, cough, abdominal pain, nausea, vomiting, diarrhea, dysuria, fevers, rashes or hallucinations unless otherwise stated above in HPI. ____________________________________________   PHYSICAL EXAM:  VITAL SIGNS: Vitals:    12/28/20 0841 12/28/20 1010  BP: 122/79 123/71  Pulse: 89 (!) 54  Resp:  18  Temp: 98 F (36.7 C)   SpO2: 97% 98%    Constitutional: Alert and oriented, ill appearing Eyes: Conjunctivae are normal.  Head: Atraumatic. Nose: No congestion/rhinnorhea. Mouth/Throat: Mucous membranes are moist.   Neck: No stridor. Painless ROM.  Cardiovascular: Normal rate, regular rhythm. Grossly normal heart sounds.  Good peripheral circulation. Respiratory: Normal respiratory effort.  No retractions. Lungs CTAB. Gastrointestinal: Soft and nontender. No distention. No abdominal bruits. No CVA tenderness. Genitourinary:  Musculoskeletal: No lower extremity tenderness nor edema.  No joint effusions. Neurologic:  Normal speech and language. No gross focal neurologic deficits are appreciated. No facial droop Skin:  Skin is warm,  dry and intact. No rash noted. Psychiatric: Mood and affect are normal. Speech and behavior are normal.  ____________________________________________   LABS (all labs ordered are listed, but only abnormal results are displayed)  Results for orders placed or performed during the hospital encounter of 12/28/20 (from the past 24 hour(s))  Lactic acid, plasma     Status: None   Collection Time: 12/28/20  8:50 AM  Result Value Ref Range   Lactic Acid, Venous 1.7 0.5 - 1.9 mmol/L  CBC     Status: Abnormal   Collection Time: 12/28/20  9:21 AM  Result Value Ref Range   WBC 6.3 4.0 - 10.5 K/uL   RBC 4.16 (L) 4.22 - 5.81 MIL/uL   Hemoglobin 13.2 13.0 - 17.0 g/dL   HCT 39.4 39.0 - 52.0 %   MCV 94.7 80.0 - 100.0 fL   MCH 31.7 26.0 - 34.0 pg   MCHC 33.5 30.0 - 36.0 g/dL   RDW 13.3 11.5 - 15.5 %   Platelets 129 (L) 150 - 400 K/uL   nRBC 0.0 0.0 - 0.2 %  Comprehensive metabolic panel     Status: Abnormal   Collection Time: 12/28/20  9:21 AM  Result Value Ref Range   Sodium 136 135 - 145 mmol/L   Potassium 3.6 3.5 - 5.1 mmol/L   Chloride 105 98 - 111 mmol/L   CO2 25 22 - 32  mmol/L   Glucose, Bld 108 (H) 70 - 99 mg/dL   BUN 14 8 - 23 mg/dL   Creatinine, Ser 0.79 0.61 - 1.24 mg/dL   Calcium 7.8 (L) 8.9 - 10.3 mg/dL   Total Protein 6.1 (L) 6.5 - 8.1 g/dL   Albumin 3.4 (L) 3.5 - 5.0 g/dL   AST 20 15 - 41 U/L   ALT <5 0 - 44 U/L   Alkaline Phosphatase 92 38 - 126 U/L   Total Bilirubin 0.9 0.3 - 1.2 mg/dL   GFR, Estimated >60 >60 mL/min   Anion gap 6 5 - 15  Urinalysis, Complete w Microscopic Urine, Clean Catch     Status: Abnormal   Collection Time: 12/28/20 10:36 AM  Result Value Ref Range   Color, Urine YELLOW YELLOW   APPearance CLEAR CLEAR   Specific Gravity, Urine >1.030 (H) 1.005 - 1.030   pH 6.0 5.0 - 8.0   Glucose, UA NEGATIVE NEGATIVE mg/dL   Hgb urine dipstick NEGATIVE NEGATIVE   Bilirubin Urine NEGATIVE NEGATIVE   Ketones, ur 40 (A) NEGATIVE mg/dL   Protein, ur NEGATIVE NEGATIVE mg/dL   Nitrite NEGATIVE NEGATIVE   Leukocytes,Ua NEGATIVE NEGATIVE   Squamous Epithelial / LPF NONE SEEN 0 - 5   WBC, UA 0-5 0 - 5 WBC/hpf   RBC / HPF 0-5 0 - 5 RBC/hpf   Bacteria, UA NONE SEEN NONE SEEN   Mucus PRESENT   Resp Panel by RT-PCR (Flu A&B, Covid) Nasopharyngeal Swab     Status: Abnormal   Collection Time: 12/28/20 11:18 AM   Specimen: Nasopharyngeal Swab; Nasopharyngeal(NP) swabs in vial transport medium  Result Value Ref Range   SARS Coronavirus 2 by RT PCR NEGATIVE NEGATIVE   Influenza A by PCR POSITIVE (A) NEGATIVE   Influenza B by PCR NEGATIVE NEGATIVE   ____________________________________________  EKG My review and personal interpretation at Time: 8:41   Indication: ams  Rate: 85  Rhythm: sinus Axis: left Other: normal intervals, no stemi ____________________________________________  RADIOLOGY  I personally reviewed all radiographic images ordered to evaluate for the  above acute complaints and reviewed radiology reports and findings.  These findings were personally discussed with the patient.  Please see medical record for radiology  report.  ____________________________________________   PROCEDURES  Procedure(s) performed:  Procedures    Critical Care performed: no ____________________________________________   INITIAL IMPRESSION / ASSESSMENT AND PLAN / ED COURSE  Pertinent labs & imaging results that were available during my care of the patient were reviewed by me and considered in my medical decision making (see chart for details).   DDX: Dehydration, sepsis, pna, uti, hypoglycemia, cva, drug effect, withdrawal, encephalitis   LUKE FALERO III is a 80 y.o. who presents to the ED with altered mental status and symptoms as described above.  Patient outside of the window for code stroke.  Imaging ordered to evaluate for the but differential shows no acute abnormality.  Blood work reassuring.  Patient is flu positive.  Have ordered Tamiflu.  Given his altered mental status falls and confusion extensive comorbidities discussed with hospitalist for admission.     The patient was evaluated in Emergency Department today for the symptoms described in the history of present illness. He/she was evaluated in the context of the global COVID-19 pandemic, which necessitated consideration that the patient might be at risk for infection with the SARS-CoV-2 virus that causes COVID-19. Institutional protocols and algorithms that pertain to the evaluation of patients at risk for COVID-19 are in a state of rapid change based on information released by regulatory bodies including the CDC and federal and state organizations. These policies and algorithms were followed during the patient's care in the ED.  As part of my medical decision making, I reviewed the following data within the [facility] notes reviewed and incorporated, Labs reviewed, notes from prior ED visits and [place] Controlled Substance Database   ____________________________________________   FINAL CLINICAL IMPRESSION(S) / ED DIAGNOSES  Final  diagnoses:  Acute encephalopathy  Influenza A      NEW MEDICATIONS STARTED DURING THIS VISIT:  New Prescriptions   No medications on file     Note:  This document was prepared using Dragon voice recognition software and may include unintentional dictation errors.    Merlyn Lot, MD 12/28/20 1326

## 2020-12-28 NOTE — ED Notes (Signed)
Rn taken over care, pt agitated and has removed himself from the monitor. Family at bedside requesting medication to be given. PRN klonopin given

## 2020-12-28 NOTE — ED Triage Notes (Signed)
Ems called to house d/t wife stated patient fell around 3;30 but came to bed and when she went to ge him up thought he had slurred speech. LKW 3:30. Communicates clearly now. Making jokes. Clean d/t wet clothing. Pulled up in bed. Placed in gown. Placed on heart montor. Ems shared is  COVID positive has taken 2 doses antiviral med. And was Z-pack.

## 2020-12-28 NOTE — ED Triage Notes (Signed)
Wife is now in room with MD at bedside, clarification that the pt was LKW @ bedtime on 12/27/20. He went to bed and was normal when he fell at 0330 is when she noticed the slurred speech.

## 2020-12-28 NOTE — ED Notes (Signed)
ED doctor in room. VSS. Son and wife present.Marland Kitchen

## 2020-12-29 ENCOUNTER — Other Ambulatory Visit: Payer: Self-pay

## 2020-12-29 DIAGNOSIS — F0280 Dementia in other diseases classified elsewhere without behavioral disturbance: Secondary | ICD-10-CM | POA: Diagnosis not present

## 2020-12-29 DIAGNOSIS — N319 Neuromuscular dysfunction of bladder, unspecified: Secondary | ICD-10-CM | POA: Diagnosis present

## 2020-12-29 DIAGNOSIS — G20 Parkinson's disease: Secondary | ICD-10-CM | POA: Diagnosis present

## 2020-12-29 DIAGNOSIS — R404 Transient alteration of awareness: Secondary | ICD-10-CM | POA: Diagnosis not present

## 2020-12-29 DIAGNOSIS — Z8546 Personal history of malignant neoplasm of prostate: Secondary | ICD-10-CM | POA: Diagnosis not present

## 2020-12-29 DIAGNOSIS — Z87891 Personal history of nicotine dependence: Secondary | ICD-10-CM | POA: Diagnosis not present

## 2020-12-29 DIAGNOSIS — F02818 Dementia in other diseases classified elsewhere, unspecified severity, with other behavioral disturbance: Secondary | ICD-10-CM | POA: Diagnosis present

## 2020-12-29 DIAGNOSIS — G9341 Metabolic encephalopathy: Secondary | ICD-10-CM | POA: Diagnosis present

## 2020-12-29 DIAGNOSIS — Z79890 Hormone replacement therapy (postmenopausal): Secondary | ICD-10-CM | POA: Diagnosis not present

## 2020-12-29 DIAGNOSIS — R4182 Altered mental status, unspecified: Secondary | ICD-10-CM | POA: Diagnosis present

## 2020-12-29 DIAGNOSIS — Z79899 Other long term (current) drug therapy: Secondary | ICD-10-CM | POA: Diagnosis not present

## 2020-12-29 DIAGNOSIS — Z66 Do not resuscitate: Secondary | ICD-10-CM | POA: Diagnosis present

## 2020-12-29 DIAGNOSIS — H409 Unspecified glaucoma: Secondary | ICD-10-CM | POA: Diagnosis present

## 2020-12-29 DIAGNOSIS — K219 Gastro-esophageal reflux disease without esophagitis: Secondary | ICD-10-CM | POA: Diagnosis present

## 2020-12-29 DIAGNOSIS — Z8616 Personal history of COVID-19: Secondary | ICD-10-CM | POA: Diagnosis not present

## 2020-12-29 DIAGNOSIS — W19XXXA Unspecified fall, initial encounter: Secondary | ICD-10-CM | POA: Diagnosis present

## 2020-12-29 DIAGNOSIS — Z87442 Personal history of urinary calculi: Secondary | ICD-10-CM | POA: Diagnosis not present

## 2020-12-29 DIAGNOSIS — F05 Delirium due to known physiological condition: Secondary | ICD-10-CM | POA: Diagnosis not present

## 2020-12-29 DIAGNOSIS — Z85828 Personal history of other malignant neoplasm of skin: Secondary | ICD-10-CM | POA: Diagnosis not present

## 2020-12-29 DIAGNOSIS — R410 Disorientation, unspecified: Secondary | ICD-10-CM | POA: Diagnosis not present

## 2020-12-29 DIAGNOSIS — Y92009 Unspecified place in unspecified non-institutional (private) residence as the place of occurrence of the external cause: Secondary | ICD-10-CM | POA: Diagnosis not present

## 2020-12-29 DIAGNOSIS — E8809 Other disorders of plasma-protein metabolism, not elsewhere classified: Secondary | ICD-10-CM | POA: Diagnosis present

## 2020-12-29 DIAGNOSIS — J101 Influenza due to other identified influenza virus with other respiratory manifestations: Secondary | ICD-10-CM | POA: Diagnosis present

## 2020-12-29 DIAGNOSIS — G4700 Insomnia, unspecified: Secondary | ICD-10-CM | POA: Diagnosis present

## 2020-12-29 DIAGNOSIS — E781 Pure hyperglyceridemia: Secondary | ICD-10-CM | POA: Diagnosis present

## 2020-12-29 DIAGNOSIS — Z20822 Contact with and (suspected) exposure to covid-19: Secondary | ICD-10-CM | POA: Diagnosis present

## 2020-12-29 DIAGNOSIS — G903 Multi-system degeneration of the autonomic nervous system: Secondary | ICD-10-CM | POA: Diagnosis present

## 2020-12-29 DIAGNOSIS — D696 Thrombocytopenia, unspecified: Secondary | ICD-10-CM | POA: Diagnosis present

## 2020-12-29 DIAGNOSIS — N400 Enlarged prostate without lower urinary tract symptoms: Secondary | ICD-10-CM | POA: Diagnosis present

## 2020-12-29 LAB — BASIC METABOLIC PANEL
Anion gap: 7 (ref 5–15)
BUN: 14 mg/dL (ref 8–23)
CO2: 28 mmol/L (ref 22–32)
Calcium: 8.1 mg/dL — ABNORMAL LOW (ref 8.9–10.3)
Chloride: 102 mmol/L (ref 98–111)
Creatinine, Ser: 0.72 mg/dL (ref 0.61–1.24)
GFR, Estimated: >60 mL/min (ref >60)
Glucose, Bld: 96 mg/dL (ref 70–99)
Potassium: 3.4 mmol/L — ABNORMAL LOW (ref 3.5–5.1)
Sodium: 137 mmol/L (ref 135–145)

## 2020-12-29 LAB — CBC
HCT: 40.7 % (ref 39.0–52.0)
Hemoglobin: 13.6 g/dL (ref 13.0–17.0)
MCH: 31.5 pg (ref 26.0–34.0)
MCHC: 33.4 g/dL (ref 30.0–36.0)
MCV: 94.2 fL (ref 80.0–100.0)
Platelets: 137 10*3/uL — ABNORMAL LOW (ref 150–400)
RBC: 4.32 MIL/uL (ref 4.22–5.81)
RDW: 13.4 % (ref 11.5–15.5)
WBC: 4.4 10*3/uL (ref 4.0–10.5)
nRBC: 0 % (ref 0.0–0.2)

## 2020-12-29 MED ORDER — MIDODRINE HCL 5 MG PO TABS
5.0000 mg | ORAL_TABLET | Freq: Three times a day (TID) | ORAL | Status: DC
  Administered 2020-12-29 – 2021-01-05 (×19): 5 mg via ORAL
  Filled 2020-12-29 (×20): qty 1

## 2020-12-29 MED ORDER — METHOCARBAMOL 1000 MG/10ML IJ SOLN
500.0000 mg | Freq: Once | INTRAVENOUS | Status: DC
  Filled 2020-12-29: qty 5

## 2020-12-29 MED ORDER — HALOPERIDOL LACTATE 5 MG/ML IJ SOLN: 2.5000 mg | Freq: Once | INTRAMUSCULAR | Status: DC

## 2020-12-29 MED ORDER — ENOXAPARIN SODIUM 40 MG/0.4ML IJ SOSY
40.0000 mg | PREFILLED_SYRINGE | INTRAMUSCULAR | Status: DC
  Administered 2020-12-29 – 2021-01-05 (×8): 40 mg via SUBCUTANEOUS
  Filled 2020-12-29 (×8): qty 0.4

## 2020-12-29 NOTE — Evaluation (Signed)
Physical Therapy Evaluation Patient Details Name: Steven Buckley MRN: [id number] DOB: 09-17-1940 Today's Date: 12/29/2020  History of Present Illness  80 y.o. male with medical history significant of Parkinson's disease with dementia, orthostatic hypotension, urinary urgency due to neurogenic bladder, laryngopharyngeal reflux (LPR), lumbosacral spondylosis with radiculopathy presented to the ED via EMS due to altered mental status and a fall.  Wife at bedside during encounter reported patient tested positive for COVID-19 on Monday (2 days ago), received an infusion on Tuesday (unable to find record of this on chart review) and prescribed Paxlovid, Z-Pak by PCP. Pt also positive for flu.   Clinical Impression  Patient alert, oriented to self, place, family in room, date (recalled earlier from OT session). Family does still endorse that he is not quite returned to normal. At baseline he reported using a rollator, but family stated he is not using DME. Several falls recently, and a decline in functional status noted.   The patient was able to perform bed mobility with supervision. MMT of BLEs symmetrical and WFLs. Several sit <> stands performed, varying from CGA-minA, but pt reliant on momentum and several attempts to complete. Pt with poor static standing, reliant on intermittent minA due to posterior lean throughout. BP assessed, pt orthostatic but asymptomatic. Able to perform 2 bouts of ambulation with and without RW, requiring CGA to ensure safety.  Overall the patient demonstrated deficits (see "PT Problem List") that impede the patient's functional abilities, safety, and mobility and would benefit from skilled PT intervention. Recommendation is SNF due to decline in functional status and current level of physical assistance needed (family unable to provide physical assist).      Recommendations for follow up therapy are one component of a multi-disciplinary discharge planning process, led by  the attending physician.  Recommendations may be updated based on patient status, additional functional criteria and insurance authorization.  Follow Up Recommendations Skilled nursing-short term rehab (<3 hours/day)    Assistance Recommended at Discharge    Functional Status Assessment Patient has had a recent decline in their functional status and demonstrates the ability to make significant improvements in function in a reasonable and predictable amount of time.  Equipment Recommendations   (TBD at next venue of care)    Recommendations for Other Services       Precautions / Restrictions Precautions Precautions: Fall Precaution Comments: droplet Restrictions Weight Bearing Restrictions: No      Mobility  Bed Mobility Overal bed mobility: Needs Assistance Bed Mobility: Supine to Sit;Sit to Supine     Supine to sit: Supervision Sit to supine: Min assist   General bed mobility comments: trunk support and cuing for hand placement    Transfers Overall transfer level: Needs assistance Equipment used: Rolling walker (2 wheels);None Transfers: Sit to/from Stand Sit to Stand: Min assist;Min guard   Step pivot transfers: Mod assist       General transfer comment: varying levels of assistance needed, required momentum for each standing attempt and several attempts to come up into standing    Ambulation/Gait   Gait Distance (Feet):  (2 bouts of 89ft) Assistive device: None;Rolling walker (2 wheels)            Stairs            Wheelchair Mobility    Modified Rankin (Stroke Patients Only)       Balance Overall balance assessment: Needs assistance Sitting-balance support: Feet supported Sitting balance-Leahy Scale: Good     Standing balance support: During  functional activity Standing balance-Leahy Scale: Poor                               Pertinent Vitals/Pain Pain Assessment: No/denies pain    Home Living Family/patient expects  to be discharged to:: Private residence Living Arrangements: Spouse/significant other Available Help at Discharge: Family;Available 24 hours/day Type of Home: Other(Comment) (condo) Home Access: Level entry       Home Layout: One level Home Equipment: Cane - quad;Shower seat - built in      Prior Function Prior Level of Function : Needs assist             Mobility Comments: Pt ambulates with quad cane per wife in room. ADLs Comments: Pt is able to perform self care tasks independently but has supervision at home for safety. Pt's wife concerned about pt standing in shower.     Hand Dominance   Dominant Hand: Right    Extremity/Trunk Assessment   Upper Extremity Assessment Upper Extremity Assessment: Generalized weakness;Defer to OT evaluation (grossly 4/5)    Lower Extremity Assessment Lower Extremity Assessment:  (grossly 4/5)    Cervical / Trunk Assessment Cervical / Trunk Assessment: Kyphotic  Communication   Communication: No difficulties  Cognition Arousal/Alertness: Awake/alert Behavior During Therapy: WFL for tasks assessed/performed Overall Cognitive Status: History of cognitive impairments - at baseline                                 General Comments: Pt's family report that his current cognition is different than his baseline. Pt is oriented to self, location, and month. He has decreased safety awareness and is impulsive with movement.        General Comments      Exercises     Assessment/Plan    PT Assessment Patient needs continued PT services  PT Problem List Decreased strength;Decreased mobility;Decreased range of motion;Decreased activity tolerance;Decreased balance;Decreased knowledge of use of DME       PT Treatment Interventions DME instruction;Therapeutic exercise;Balance training;Gait training;Stair training;Neuromuscular re-education;Functional mobility training;Therapeutic activities;Patient/family education    PT  Goals (Current goals can be found in the Care Plan section)  Acute Rehab PT Goals Patient Stated Goal: to go home PT Goal Formulation: With patient Time For Goal Achievement: 01/12/21 Potential to Achieve Goals: Good    Frequency Min 2X/week   Barriers to discharge Decreased caregiver support      Co-evaluation               AM-PAC PT "6 Clicks" Mobility  Outcome Measure Help needed turning from your back to your side while in a flat bed without using bedrails?: None Help needed moving from lying on your back to sitting on the side of a flat bed without using bedrails?: None Help needed moving to and from a bed to a chair (including a wheelchair)?: A Little Help needed standing up from a chair using your arms (e.g., wheelchair or bedside chair)?: A Little Help needed to walk in hospital room?: A Little Help needed climbing 3-5 steps with a railing? : A Lot 6 Click Score: 19    End of Session Equipment Utilized During Treatment: Gait belt Activity Tolerance: Patient tolerated treatment well Patient left: in chair;with chair alarm set;with call bell/phone within reach;with family/visitor present Nurse Communication: Mobility status PT Visit Diagnosis: Other abnormalities of gait and mobility (R26.89);Difficulty in walking,  not elsewhere classified (R26.2);Muscle weakness (generalized) (M62.81)    Time: 1522-1600 PT Time Calculation (min) (ACUTE ONLY): 38 min   Charges:   PT Evaluation $PT Eval Low Complexity: 1 Low PT Treatments $Therapeutic Activity: 23-37 mins       Lieutenant Diego PT, DPT 4:23 PM,12/29/20

## 2020-12-29 NOTE — Evaluation (Signed)
Occupational Therapy Evaluation Patient Details Name: Steven Buckley MRN: [id number] DOB: Jun 09, 1940 Today's Date: 12/29/2020   History of Present Illness 80 y.o. male with medical history significant of Parkinson's disease with dementia, orthostatic hypotension, urinary urgency due to neurogenic bladder, laryngopharyngeal reflux (LPR), lumbosacral spondylosis with radiculopathy presented to the ED via EMS due to altered mental status and a fall.  Wife at bedside during encounter reported patient tested positive for COVID-19 on Monday (2 days ago), received an infusion on Tuesday (unable to find record of this on chart review) and prescribed Paxlovid, Z-Pak by PCP. Pt also positive for flu.   Clinical Impression   Patient presenting with decreased Ind in self care, balance, functional mobility/transfers, endurance, and safety awareness. Patient's family present to confirm baseline. Pt reports being able to perform self care tasks and ambulate with use of quad cane and supervision from family. Pt's wife preforms all IADL tasks within the home.  Patient currently functioning at min - mod HHA without use of AD for toileting needs, LB clothing management, hygiene, and standing at sink for grooming. Pt does have LOB in bathroom with turning in tight space requiring mod A to correct.Pt's wife is unable to provide this level of assistance to pt at discharge. OT recommend short term rehab at discharge to address functional deficits.  Patient will benefit from acute OT to increase overall independence in the areas of ADLs, functional mobility, and safety awareness in order to safely discharge to next venue of care.      Recommendations for follow up therapy are one component of a multi-disciplinary discharge planning process, led by the attending physician.  Recommendations may be updated based on patient status, additional functional criteria and insurance authorization.   Follow Up Recommendations   Skilled nursing-short term rehab (<3 hours/day)    Assistance Recommended at Discharge Frequent or constant Supervision/Assistance  Functional Status Assessment  Patient has had a recent decline in their functional status and demonstrates the ability to make significant improvements in function in a reasonable and predictable amount of time.  Equipment Recommendations  Other (comment) (defer to next venue of care)       Precautions / Restrictions Precautions Precautions: Fall Precaution Comments: droplet Restrictions Weight Bearing Restrictions: No      Mobility Bed Mobility Overal bed mobility: Needs Assistance Bed Mobility: Supine to Sit;Sit to Supine     Supine to sit: Min assist Sit to supine: Min assist   General bed mobility comments: trunk support and cuing for hand placement    Transfers Overall transfer level: Needs assistance Equipment used: 1 person hand held assist Transfers: Sit to/from Stand;Bed to chair/wheelchair/BSC Sit to Stand: Min assist     Step pivot transfers: Mod assist            Balance Overall balance assessment: Needs assistance Sitting-balance support: Feet supported Sitting balance-Leahy Scale: Good     Standing balance support: During functional activity Standing balance-Leahy Scale: Poor                             ADL either performed or assessed with clinical judgement   ADL Overall ADL's : Needs assistance/impaired     Grooming: Wash/dry hands;Oral care;Standing;Minimal assistance                   Toilet Transfer: Moderate assistance;Comfort height toilet;Grab bars       Tub/ Shower Transfer: Moderate assistance  Functional mobility during ADLs: Minimal assistance;Moderate assistance General ADL Comments: Pt needing assistance for hygiene and clothing management. He was unaware of small BM. Pt does have 1 LOB when turning around in small space requring mod A to correct.     Vision Patient  Visual Report: No change from baseline              Pertinent Vitals/Pain Pain Assessment: No/denies pain     Hand Dominance Right   Extremity/Trunk Assessment Upper Extremity Assessment Upper Extremity Assessment: Generalized weakness   Lower Extremity Assessment Lower Extremity Assessment: Defer to PT evaluation       Communication Communication Communication: No difficulties   Cognition Arousal/Alertness: Awake/alert Behavior During Therapy: Impulsive Overall Cognitive Status: History of cognitive impairments - at baseline                                 General Comments: Pt's family report that his current cognition is different than his baseline. Pt is oriented to self, location, and month. He has decreased safety awareness and is impulsive with movement.                Home Living Family/patient expects to be discharged to:: Private residence Living Arrangements: Spouse/significant other Available Help at Discharge: Family;Available 24 hours/day Type of Home: Other(Comment) (condo) Home Access: Level entry     Home Layout: One level     Bathroom Shower/Tub: Occupational psychologist: Standard     Home Equipment: Cane - quad;Shower seat - built in          Prior Functioning/Environment Prior Level of Function : Needs assist             Mobility Comments: Pt ambulates with quad cane per wife in room. ADLs Comments: Pt is able to perform self care tasks independently but has supervision at home for safety. Pt's wife concerned about pt standing in shower.        OT Problem List: Decreased strength;Decreased coordination;Decreased cognition;Decreased activity tolerance;Decreased safety awareness;Impaired balance (sitting and/or standing);Decreased knowledge of use of DME or AE      OT Treatment/Interventions: Self-care/ADL training;Therapeutic exercise;Therapeutic activities;Energy conservation;DME and/or AE  instruction;Patient/family education;Balance training;Manual therapy    OT Goals(Current goals can be found in the care plan section) Acute Rehab OT Goals Patient Stated Goal: to get rehab OT Goal Formulation: With family Time For Goal Achievement: 01/12/21 Potential to Achieve Goals: Good ADL Goals Pt Will Perform Grooming: with supervision;standing Pt Will Perform Lower Body Dressing: with supervision;sit to/from stand Pt Will Transfer to Toilet: with supervision;ambulating Pt Will Perform Toileting - Clothing Manipulation and hygiene: with supervision;sit to/from stand  OT Frequency: Min 2X/week   Barriers to D/C: Other (comment)  wife is unable to provide this level of assist for pt at home          AM-PAC OT "6 Clicks" Daily Activity     Outcome Measure Help from another person eating meals?: None Help from another person taking care of personal grooming?: A Little Help from another person toileting, which includes using toliet, bedpan, or urinal?: A Lot Help from another person bathing (including washing, rinsing, drying)?: A Lot Help from another person to put on and taking off regular upper body clothing?: A Little Help from another person to put on and taking off regular lower body clothing?: A Lot 6 Click Score: 16   End of Session Nurse  Communication: Mobility status  Activity Tolerance: Patient tolerated treatment well Patient left: in bed;with call bell/phone within reach;with bed alarm set;with family/visitor present  OT Visit Diagnosis: Unsteadiness on feet (R26.81);Repeated falls (R29.6);Muscle weakness (generalized) (M62.81);History of falling (Z91.81)                Time: 1420-1450 OT Time Calculation (min): 30 min Charges:  OT General Charges $OT Visit: 1 Visit OT Evaluation $OT Eval Moderate Complexity: 1 Mod OT Treatments $Self Care/Home Management : 23-37 mins  Darleen Crocker, MS, OTR/L , CBIS ascom [phone]  12/29/20, 3:45 PM

## 2020-12-29 NOTE — Progress Notes (Addendum)
Pt was admitted in the floor with no signs of distress. Pt alert x self only. All screening admission was completed through pt son Cristie Hem. VSS. Pt and son was educated about safety and ascom within pt reach. Son refused bed alarm at this time. Will continue to monitor.  Update 0216: Staff was called by son at bedside. Pt was pulling out IV and heart monitor when staff went in the room/ Pt was hard to re-direct (Per sons report pt can be hard to re-direct with agitation). Haldol 2.5 mg  IV given once for agitation. Will continue to monitor,  Update 0246: NP Mprrison placed order for methocarbamol (ROBAXIN) 500 mg in dextrose 5 % 50 ml IVPB once. Will continue to monitor.  Update 0300.Pt son is requesting to discontinue tele due to the alarm and pt not able to rest. Per sons report pt have not sleep for 2 days. Pt sons was educated about its importance to monitor since haldol just administered.NP Randol Kern made aware. Will continue to monitor  Update 0328: NP Randol Kern place order. Will continue to monitor.  Update 0546: Refused orthostatic vital signs at this time, Per sons request would like to do orthostatic vitals at 0800. Will notify incoming shift. Will continue to monitor.  Update 0657: Pt sons requested to draw lab between 0730-0800 today. Lab made aware. Will notify incoming shift, Will continue to monitor.

## 2020-12-29 NOTE — Progress Notes (Signed)
   12/29/20 1152 12/29/20 1155  Vitals  BP  --  (!) 68/48  MAP (mmHg)  --  (!) 54  BP Location  --  Right Arm  BP Method  --  Automatic  Patient Position (if appropriate)  --  Standing  Pulse Rate  --  78  Pulse Rate Source  --  Monitor  Resp  --  17  Level of Consciousness  Level of Consciousness Alert Alert  Oxygen Therapy  SpO2  --  100 %  O2 Device  --  Room Air   Orthostatic vitals. MD made aware.

## 2020-12-29 NOTE — Progress Notes (Signed)
PROGRESS NOTE    Steven Buckley  IWL:[id number] DOB: [birth year] DOA: 12/28/2020 PCP: Rusty Aus, MD    Brief Narrative:  80 y.o. male with medical history significant of Parkinson's disease with dementia, orthostatic hypotension, urinary urgency due to neurogenic bladder, laryngopharyngeal reflux (LPR), lumbosacral spondylosis with radiculopathy presented to the ED via EMS due to altered mental status and a fall.  Wife at bedside during encounter reported patient tested positive for COVID-19 on Monday (2 days ago), received an infusion on Tuesday (unable to find record of this on chart review) and prescribed Paxlovid, Z-Pak by PCP.  She reports patient had increasing confusion last night, overnight was very restless and unable to sleep, was up and down to use the bathroom but had difficulty finding the bathroom.  Then around 3:30 AM had a fall in between their 2 twin beds and was unable to get up.  Wife reported at that time he was very minimally responsive, could not move and she thought he had had a stroke.  EMS was called to bring patient to the hospital for further evaluation  CT and MRI imaging survey negative for CVA.  Had some agitation on 11/30.  Received Haldol overnight.  More sleepy this morning.  Lengthy discussion with son and wife at bedside.  Reports progressive worsening of functional status over past several years secondary to Parkinson's.  They are interested in finding a long-term care versus rehab facility.  Patient is influenza positive likely exacerbating his underlying symptoms.   Assessment & Plan:   Principal Problem:   Altered mental status Active Problems:   Influenza A   Dementia 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 (HCC)   Neurogenic orthostatic hypotension (HCC)  Acute metabolic encephalopathy Decreased level of consciousness wife described as significantly reduced responsiveness and inability to move after a fall overnight.  Wife  suspected stroke.  Noncontrast head CT and brain MRI negative for acute findings.  Neuro exam is nonfocal.  Given history of orthostatic hypotension, suspect cerebral hypoperfusion was the cause.  During admission encounter, wife reports patient much improved and near baseline. Plan: Delirium precautions Frequent neurochecks Treat underlying causes as below Therapy evaluations TOC consult  Influenza A infection  without signs of pneumonia on chest x-ray.   Not hypoxic on admission but SPO2 on the monitor during admission encounter would fluctuate and intermittently dropped to the upper 80s. No fevers, not septic Likely underlying his current presentation Plan: Continue Tamiflu Supplemental oxygen if needed Antitussives as needed APAP as needed fever  Recent COVID positivity Patient states that he tested positive for COVID-19 on Monday 2 days prior to admission.  He apparently received an infusion on Tuesday and prescribed Paxil bid and Z-Pak by PCP.  Interestingly he tested negative for COVID here, \ Plan: No pneumonia, no fever, no oxygen requirement No indication for remdesivir or steroids Supportive care   Fall most likely secondary to generalized weakness in the setting of above infection, orthostasis may have contributed as well.  Patient typically independent with ambulation, no assist device.  Has occasional falls at home per family, due to gait abnormality related to Parkinson's. Plan: Fall precautions Therapy evaluations TOC consult   Neurogenic orthostatic hypotension  chronic, secondary to Parkinson's disease. --Continue home Florinef and midodrine   Chronic constipation secondary Parkinson's disease. --Continue with daily Metamucil   Dementia due to Parkinson's disease patient follows with neurologist, Dr. Manuella Ghazi. --Continue home Sinemet IR 4 times daily and Sinemet CR at bedtime --Continue  home Klonopin as needed   Insomnia chronic per chart  review --Melatonin at bedtime   Urinary urgency due to neurogenic bladder  related to Parkinson's. --Continue Flomax as needed   DVT prophylaxis: SQ Lovenox Code Status: DNR Family Communication: Wife and son at bedside 12/1 Disposition Plan: Status is: Observation  The patient will require care spanning > 2 midnights and should be moved to inpatient because: Acute altered mentation.  Depressed level of consciousness.  Influenza A infection      Level of care: Telemetry Medical  Consultants:  None  Procedures:  None  Antimicrobials: Tamiflu   Subjective: Seen and examined.  Sleepy this morning.  Unable to participate in interview  Objective: Vitals:   12/28/20 2240 12/29/20 0019 12/29/20 0354 12/29/20 0755  BP:  113/75 127/86 131/80  Pulse:  85 77 65  Resp:  16 16 18   Temp:  98.9 F (37.2 C) 98.4 F (36.9 C) 97.8 F (36.6 C)  TempSrc:    Oral  SpO2:  98% 99% 99%  Weight: 80 kg     Height: 5\' 11"  (1.803 m)       Intake/Output Summary (Last 24 hours) at 12/29/2020 1058 Last data filed at 12/29/2020 0000 Gross per 24 hour  Intake 236 ml  Output --  Net 236 ml   Filed Weights   12/28/20 1808 12/28/20 2240  Weight: 81.6 kg 80 kg    Examination:  General exam: No acute distress.  Lethargic Respiratory system: Bibasilar crackles.  Normal work of breathing.  Room air Cardiovascular system: S1-S2, RRR, no murmurs, no pedal edema Gastrointestinal system: Soft, NT/ND, normal bowel sounds Central nervous system: Lethargic.  Oriented x0.  No focal deficits Extremities: Unable to assess power Skin: No rashes, lesions or ulcers Psychiatry: Judgement and insight appear impaired. Mood & affect flattened.     Data Reviewed: I have personally reviewed following labs and imaging studies  CBC: Recent Labs  Lab 12/28/20 0921 12/29/20 0812  WBC 6.3 4.4  HGB 13.2 13.6  HCT 39.4 40.7  MCV 94.7 94.2  PLT 129* 382*   Basic Metabolic Panel: Recent Labs  Lab  12/28/20 0921 12/29/20 0812  NA 136 137  K 3.6 3.4*  CL 105 102  CO2 25 28  GLUCOSE 108* 96  BUN 14 14  CREATININE 0.79 0.72  CALCIUM 7.8* 8.1*   GFR: Estimated Creatinine Clearance: 75.8 mL/min (by C-G formula based on SCr of 0.72 mg/dL). Liver Function Tests: Recent Labs  Lab 12/28/20 0921  AST 20  ALT <5  ALKPHOS 92  BILITOT 0.9  PROT 6.1*  ALBUMIN 3.4*   No results for input(s): LIPASE, AMYLASE in the last 168 hours. No results for input(s): AMMONIA in the last 168 hours. Coagulation Profile: No results for input(s): INR, PROTIME in the last 168 hours. Cardiac Enzymes: No results for input(s): CKTOTAL, CKMB, CKMBINDEX, TROPONINI in the last 168 hours. BNP (last 3 results) No results for input(s): PROBNP in the last 8760 hours. HbA1C: No results for input(s): HGBA1C in the last 72 hours. CBG: No results for input(s): GLUCAP in the last 168 hours. Lipid Profile: No results for input(s): CHOL, HDL, LDLCALC, TRIG, CHOLHDL, LDLDIRECT in the last 72 hours. Thyroid Function Tests: No results for input(s): TSH, T4TOTAL, FREET4, T3FREE, THYROIDAB in the last 72 hours. Anemia Panel: No results for input(s): VITAMINB12, FOLATE, FERRITIN, TIBC, IRON, RETICCTPCT in the last 72 hours. Sepsis Labs: Recent Labs  Lab 12/28/20 0850  LATICACIDVEN 1.7  Recent Results (from the past 240 hour(s))  Resp Panel by RT-PCR (Flu A&B, Covid) Nasopharyngeal Swab     Status: Abnormal   Collection Time: 12/28/20 11:18 AM   Specimen: Nasopharyngeal Swab; Nasopharyngeal(NP) swabs in vial transport medium  Result Value Ref Range Status   SARS Coronavirus 2 by RT PCR NEGATIVE NEGATIVE Final    Comment: (NOTE) SARS-CoV-2 target nucleic acids are NOT DETECTED.  The SARS-CoV-2 RNA is generally detectable in upper respiratory specimens during the acute phase of infection. The lowest concentration of SARS-CoV-2 viral copies this assay can detect is 138 copies/mL. A negative result does not  preclude SARS-Cov-2 infection and should not be used as the sole basis for treatment or other patient management decisions. A negative result may occur with  improper specimen collection/handling, submission of specimen other than nasopharyngeal swab, presence of viral mutation(s) within the areas targeted by this assay, and inadequate number of viral copies(<138 copies/mL). A negative result must be combined with clinical observations, patient history, and epidemiological information. The expected result is Negative.  Fact Sheet for Patients:  EntrepreneurPulse.com.au  Fact Sheet for Healthcare Providers:  IncredibleEmployment.be  This test is no t yet approved or cleared by the [place] FDA and  has been authorized for detection and/or diagnosis of SARS-CoV-2 by FDA under an Emergency Use Authorization (EUA). This EUA will remain  in effect (meaning this test can be used) for the duration of the COVID-19 declaration under Section 564(b)(1) of the Act, 21 U.S.C.section 360bbb-3(b)(1), unless the authorization is terminated  or revoked sooner.       Influenza A by PCR POSITIVE (A) NEGATIVE Final   Influenza B by PCR NEGATIVE NEGATIVE Final    Comment: (NOTE) The Xpert Xpress SARS-CoV-2/FLU/RSV plus assay is intended as an aid in the diagnosis of influenza from Nasopharyngeal swab specimens and should not be used as a sole basis for treatment. Nasal washings and aspirates are unacceptable for Xpert Xpress SARS-CoV-2/FLU/RSV testing.  Fact Sheet for Patients: EntrepreneurPulse.com.au  Fact Sheet for Healthcare Providers: IncredibleEmployment.be  This test is not yet approved or cleared by the [place] FDA and has been authorized for detection and/or diagnosis of SARS-CoV-2 by FDA under an Emergency Use Authorization (EUA). This EUA will remain in effect (meaning this test can be used) for the  duration of the COVID-19 declaration under Section 564(b)(1) of the Act, 21 U.S.C. section 360bbb-3(b)(1), unless the authorization is terminated or revoked.  Performed at [facility], [address], [place], [place] [address]          Radiology Studies: DG Lumbar Spine 2-3 Views  Result Date: 12/28/2020 CLINICAL DATA:  Fever. Altered mental status. Low back pain. Fell last night. EXAM: LUMBAR SPINE - 2-3 VIEW COMPARISON:  MRI 11/22/2017 FINDINGS: No evidence of acute lumbar region fracture. There is old fracture at T12 with fusion of T11 and T12 having occurred. Ordinary mild degenerative changes of the disc spaces and facet joints seen in the lumbar region. IMPRESSION: No acute lumbar region fracture. Old compression fracture at T12 with subsequent fusion of T11 and T12. Electronically Signed   By: Nelson Chimes M.D.   On: 12/28/2020 09:18   CT HEAD WO CONTRAST (5MM)  Result Date: 12/28/2020 CLINICAL DATA:  Head trauma, minor. Fell last night. Some speech disturbance. EXAM: CT HEAD WITHOUT CONTRAST TECHNIQUE: Contiguous axial images were obtained from the base of the skull through the vertex without intravenous contrast. COMPARISON:  07/09/2013 FINDINGS: Brain: Generalized atrophy. Mild chronic  small-vessel ischemic change of the white matter. No sign of acute infarction, mass lesion, hemorrhage, hydrocephalus or extra-axial collection. Vascular: There is atherosclerotic calcification of the major vessels at the base of the brain. Skull: Negative Sinuses/Orbits: Clear/normal Other: None IMPRESSION: No acute or traumatic finding.  Cerebral Atrophy (ICD10-G31.9). Electronically Signed   By: Nelson Chimes M.D.   On: 12/28/2020 09:13   CT Cervical Spine Wo Contrast  Result Date: 12/28/2020 CLINICAL DATA:  [place] last night.  Trauma to the head and neck. EXAM: CT CERVICAL SPINE WITHOUT CONTRAST TECHNIQUE: Multidetector CT imaging of the cervical spine was performed without  intravenous contrast. Multiplanar CT image reconstructions were also generated. COMPARISON:  None. FINDINGS: Alignment: The study suffers from motion degradation. Allowing for that, no malalignment is suspected other than 2 mm of chronic degenerative anterolisthesis at C7-T1. Skull base and vertebrae: No evidence of regional fracture. Considerable motion degradation in the region from C2 through C4. Soft tissues and spinal canal: Negative Disc levels: Chronic degenerative spondylosis C5-6 and C6-7. Chronic facet osteoarthritis at C7-T1 with 2 mm of anterolisthesis. Upper chest: Negative Other: None IMPRESSION: Considerable motion degradation, particularly in the region from C2 through C4. Allowing for that, no fracture is suspected, but a subtle fracture might be inapparent. If clinical suspicion is high, consider repeat examination. Ordinary cervical spondylosis and facet osteoarthritis. Electronically Signed   By: Nelson Chimes M.D.   On: 12/28/2020 09:15   MR BRAIN WO CONTRAST  Result Date: 12/28/2020 CLINICAL DATA:  Acute neuro deficit.  Slurred speech EXAM: MRI HEAD WITHOUT CONTRAST TECHNIQUE: Multiplanar, multiecho pulse sequences of the brain and surrounding structures were obtained without intravenous contrast. COMPARISON:  CT head 12/18/2020 FINDINGS: Brain: Mild atrophy without hydrocephalus. Negative for hemorrhage or mass. Minimal chronic white matter changes Negative for acute infarct. Vascular: Decreased flow void in the distal right vertebral artery similar to the MRI of July 09, 2013. Otherwise normal arterial flow voids at the skull base. Skull and upper cervical spine: Negative Sinuses/Orbits: Mild mucosal edema paranasal sinuses. Bilateral cataract extraction Other: None IMPRESSION: No acute abnormality. Decreased flow void distal right vertebral artery which is chronic and may be due to occlusion or decreased flow. Electronically Signed   By: Franchot Gallo M.D.   On: 12/28/2020 12:30   DG  Chest Portable 1 View  Result Date: 12/28/2020 CLINICAL DATA:  [place] last night.  Altered mental status. EXAM: PORTABLE CHEST 1 VIEW COMPARISON:  12/10/2013 FINDINGS: Heart size upper limits of normal. Tortuous aorta. Poor inspiration. Allowing for that, the lungs are clear. No edema, infiltrate, collapse or effusion. IMPRESSION: Poor inspiration.  No active disease suspected.  Tortuous aorta. Electronically Signed   By: Nelson Chimes M.D.   On: 12/28/2020 09:18        Scheduled Meds:  carbidopa-levodopa  1 tablet Oral QHS   carbidopa-levodopa  2 tablet Oral QID   cholecalciferol  1,000 Units Oral Daily   fludrocortisone  0.1 mg Oral BID   haloperidol lactate  2.5 mg Intravenous Once   melatonin  10 mg Oral QHS   midodrine  2.5 mg Oral TID WC   multivitamin-lutein  1 capsule Oral BID   niacin  1,000 mg Oral Q0600   oseltamivir  75 mg Oral BID   psyllium  1 packet Oral Daily   timolol  1 drop Both Eyes Daily   Continuous Infusions:  methocarbamol (ROBAXIN) IV       LOS: 0 days    Time spent: 54  minutes    Sidney Ace, MD [facility]   If 7PM-7AM, please contact night-coverage  12/29/2020, 10:58 AM

## 2020-12-30 DIAGNOSIS — R404 Transient alteration of awareness: Secondary | ICD-10-CM | POA: Diagnosis not present

## 2020-12-30 MED ORDER — DIPHENHYDRAMINE HCL 25 MG PO CAPS
25.0000 mg | ORAL_CAPSULE | Freq: Four times a day (QID) | ORAL | Status: DC | PRN
  Administered 2020-12-30: 25 mg via ORAL
  Filled 2020-12-30: qty 1

## 2020-12-30 MED ORDER — TRAZODONE HCL 50 MG PO TABS
50.0000 mg | ORAL_TABLET | Freq: Every day | ORAL | Status: DC
  Administered 2020-12-30 – 2021-01-05 (×7): 50 mg via ORAL
  Filled 2020-12-30 (×7): qty 1

## 2020-12-30 MED ORDER — MENTHOL 3 MG MT LOZG
1.0000 | LOZENGE | OROMUCOSAL | Status: DC | PRN
  Filled 2020-12-30: qty 9

## 2020-12-30 MED ORDER — IBUPROFEN 400 MG PO TABS
400.0000 mg | ORAL_TABLET | Freq: Four times a day (QID) | ORAL | Status: DC | PRN
  Administered 2020-12-30 – 2021-01-06 (×3): 400 mg via ORAL
  Filled 2020-12-30 (×4): qty 1

## 2020-12-30 NOTE — TOC Initial Note (Signed)
Transition of Care [facility]) - Initial/Assessment Note    Patient Details  Name: Steven Buckley MRN: [id number] Date of Birth: 29-Jul-1940  [facility] CM/SW Contact:    Shelbie Hutching, RN Phone Number: 12/30/2020, 2:29 PM  Clinical Narrative:                 Patient admitted to the hospital with altered mental status, influenza A, tested positive for COVID at home but not here in the hospital.  Patient has a history of Parkinson's and dementia.  RNCM met with wife and patient at the bedside and then spoke with son via phone.  Patient and wife live in a condo at [facility].  Wife and patient would like short term rehab, their first preference is [facility] as patient goes to OP rehab at [facility], they are aware that [facility] probably does not have a bed available.  Bed request also sent out to [facility] and [facility].    Expected Discharge Plan: Skilled Nursing Facility Barriers to Discharge: Continued Medical Work up   Patient Goals and CMS Choice Patient states their goals for this hospitalization and ongoing recovery are:: Wife and son and patient are interested in short term rehab CMS Medicare.gov Compare Post Acute Care list provided to:: Patient Choice offered to / list presented to : Patient, Spouse  Expected Discharge Plan and Services Expected Discharge Plan: [place]   Discharge Planning Services: CM Consult Post Acute Care Choice: [place] Living arrangements for the past 2 months: Single Family Home                 DME Arranged: N/A DME Agency: NA       HH Arranged: NA          Prior Living Arrangements/Services Living arrangements for the past 2 months: Single Family Home Lives with:: Spouse Patient language and need for interpreter reviewed:: Yes Do you feel safe going back to the place where you live?: Yes      Need for Family Participation in Patient Care: Yes (Comment) Care giver  support system in place?: Yes (comment) Current home services: DME (bedside commode, Rollator, cane and a wheelchair) Criminal Activity/Legal Involvement Pertinent to Current Situation/Hospitalization: No - Comment as needed  Activities of Daily Living Home Assistive Devices/Equipment: None ADL Screening (condition at time of admission) Patient's cognitive ability adequate to safely complete daily activities?: No Is the patient deaf or have difficulty hearing?: No Does the patient have difficulty seeing, even when wearing glasses/contacts?: Yes Does the patient have difficulty concentrating, remembering, or making decisions?: Yes Patient able to express need for assistance with ADLs?: No Does the patient have difficulty dressing or bathing?: Yes Independently performs ADLs?: No Communication: Needs assistance Is this a change from baseline?: Pre-admission baseline Dressing (OT): Needs assistance Is this a change from baseline?: Pre-admission baseline Grooming: Needs assistance Is this a change from baseline?: Pre-admission baseline Feeding: Needs assistance Is this a change from baseline?: Pre-admission baseline Bathing: Needs assistance Is this a change from baseline?: Pre-admission baseline Toileting: Needs assistance Is this a change from baseline?: Pre-admission baseline In/Out Bed: Needs assistance Is this a change from baseline?: Pre-admission baseline Walks in Home: Needs assistance Is this a change from baseline?: Pre-admission baseline Does the patient have difficulty walking or climbing stairs?: Yes Weakness of Legs: Both Weakness of Arms/Hands: Both  Permission Sought/Granted Permission sought to share information with : Case Manager, Family Supports,  Facility Art therapist granted to share information with : Yes, Verbal Permission Granted  Share Information with NAME: Candler Ginsberg  Permission granted to share info w AGENCY: SNF's in [place] granted to share info w Relationship: wife  Permission granted to share info w Contact Information: [phone]  Emotional Assessment Appearance:: Appears stated age Attitude/Demeanor/Rapport: Engaged Affect (typically observed): Accepting Orientation: : Oriented to Self, Oriented to Place, Oriented to  Time, Oriented to Situation Alcohol / Substance Use: Not Applicable Psych Involvement: No (comment)  Admission diagnosis:  Altered mental status [R41.82] Influenza A [J10.1] Acute encephalopathy [G93.40] AMS (altered mental status) [T70.01] Acute metabolic encephalopathy [V49.44] Patient Active Problem List   Diagnosis Date Noted   Acute metabolic encephalopathy 96/75/9163   Altered mental status 12/28/2020   Influenza A 12/28/2020   Dementia 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 ([place]) 12/28/2020   Neurogenic orthostatic hypotension ([place]) 12/28/2020   PCP:  Rusty Aus, MD Pharmacy:   [place] [place] [facility] [address] [place] [address] Phone: [phone] Fax: [phone]     Social Determinants of Health (SDOH) Interventions    Readmission Risk Interventions No flowsheet data found.

## 2020-12-30 NOTE — Progress Notes (Signed)
PROGRESS NOTE    Steven Buckley  KGU:[id number] DOB: 09-23-1940 DOA: 12/28/2020 PCP: Rusty Aus, MD    Brief Narrative:  80 y.o. male with medical history significant of Parkinson's disease with dementia, orthostatic hypotension, urinary urgency due to neurogenic bladder, laryngopharyngeal reflux (LPR), lumbosacral spondylosis with radiculopathy presented to the ED via EMS due to altered mental status and a fall.  Wife at bedside during encounter reported patient tested positive for COVID-19 on Monday (2 days ago), received an infusion on Tuesday (unable to find record of this on chart review) and prescribed Paxlovid, Z-Pak by PCP.  She reports patient had increasing confusion last night, overnight was very restless and unable to sleep, was up and down to use the bathroom but had difficulty finding the bathroom.  Then around 3:30 AM had a fall in between their 2 twin beds and was unable to get up.  Wife reported at that time he was very minimally responsive, could not move and she thought he had had a stroke.  EMS was called to bring patient to the hospital for further evaluation  CT and MRI imaging survey negative for CVA.  Had some agitation on 11/30.  Received Haldol overnight.  More sleepy this morning.  Lengthy discussion with son and wife at bedside.  Reports progressive worsening of functional status over past several years secondary to Parkinson's.  They are interested in finding a long-term care versus rehab facility.  Patient is influenza positive likely exacerbating his underlying symptoms.  12/2: Patient much more awake this morning.  Communicating.  Sitting up in chair.  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  Wife suspected stroke.  Noncontrast head CT and brain MRI negative for acute findings.  Neuro exam is nonfocal.  Given history of orthostatic hypotension, suspect cerebral hypoperfusion was the cause.  During admission encounter, wife reports patient much improved and near baseline. 12/2: Patient much more awake.  Not agitated Plan: Delirium precautions Frequent neurochecks Treat underlying causes as below Therapy evaluations, recommend skilled nursing facility [facility] consult  Influenza A infection  without signs of pneumonia on chest x-ray.   Not hypoxic on admission but SPO2 on the monitor during admission encounter would fluctuate and intermittently dropped to the upper 80s. No fevers, not septic Likely underlying his current presentation Plan: Continue Tamiflu to complete total course Supplemental oxygen if needed, currently on room air Antitussives as needed APAP as needed fever  Recent COVID positivity Patient states that he tested positive for COVID-19 on Monday 2 days prior to admission.  He apparently received an infusion on Tuesday and prescribed Paxlovid and Z-Pak by PCP.  Interestingly he tested negative for COVID here Plan: No pneumonia, no fever, no oxygen requirement No indication for remdesivir or steroids Supportive care   Fall most likely secondary to generalized weakness in the setting of above infection, orthostasis may have contributed as well.  Patient typically independent with ambulation, no assist device.  Has occasional falls at home per family, due to gait abnormality related to Parkinson's. Plan: Fall precautions Therapy evaluations, recommend skilled nursing facility [facility] consult   Neurogenic orthostatic hypotension  chronic, secondary to Parkinson's disease. --Continue home Florinef and midodrine    Chronic constipation secondary Parkinson's disease. --Continue with daily Metamucil   Dementia due to Parkinson's disease patient follows with neurologist, Dr. Manuella Ghazi. --Continue home Sinemet IR 4 times daily and Sinemet CR at bedtime --Continue home Klonopin as needed   Insomnia chronic per chart review --Melatonin at bedtime   Urinary urgency due to neurogenic bladder  related to Parkinson's. --Continue Flomax as needed   DVT prophylaxis: SQ Lovenox Code Status: DNR Family Communication: Wife and son at bedside 12/1, 12/2 Disposition Plan: Status is: Inpatient  Remains inpatient appropriate because: Acute metabolic encephalopathy in setting of Parkinson's and acute viral syndrome.  Approaching medical readiness for discharge.  If mental status remains intact anticipate medical readiness for discharge as of 12/3.  Will need skilled nursing facility.            Level of care: Telemetry Medical  Consultants:  None  Procedures:  None  Antimicrobials: Tamiflu   Subjective: Seen and examined.  Much more awake this morning.  Sitting up in chair.  Answers questions appropriately.  Objective: Vitals:   12/29/20 1224 12/29/20 2032 12/30/20 0555 12/30/20 0822  BP: 118/75 137/75 (!) 146/96 (!) 172/99  Pulse: 64 (!) 57 73 62  Resp:  16 20 16   Temp: 97.8 F (36.6 C) 98.3 F (36.8 C) 98.3 F (36.8 C) 98.1 F (36.7 C)  TempSrc: Oral Oral Oral Oral  SpO2: 100% 97% 99% 99%  Weight:      Height:        Intake/Output Summary (Last 24 hours) at 12/30/2020 1106 Last data filed at 12/30/2020 0615 Gross per 24 hour  Intake 360 ml  Output 475 ml  Net -115 ml   Filed Weights   12/28/20 1808 12/28/20 2240  Weight: 81.6 kg 80 kg    Examination:  General exam: No acute distress.  Sitting up in chair.  Answers questions appropriately Respiratory system: Bibasilar crackles.  Normal work of breathing.  Room air Cardiovascular system: S1-S2, RRR, no murmurs, no pedal  edema Gastrointestinal system: Soft, NT/ND, normal bowel sounds Central nervous system: Alert and oriented.  No focal deficits Extremities: 5X5 power BUE, gait not assessed Skin: No rashes, lesions or ulcers Psychiatry: Judgement and insight appear impaired. Mood & affect flattened.     Data Reviewed: I have personally reviewed following labs and imaging studies  CBC: Recent Labs  Lab 12/28/20 0921 12/29/20 0812  WBC 6.3 4.4  HGB 13.2 13.6  HCT 39.4 40.7  MCV 94.7 94.2  PLT 129* 035*   Basic Metabolic Panel: Recent Labs  Lab 12/28/20 0921 12/29/20 0812  NA 136 137  K 3.6 3.4*  CL 105 102  CO2 25 28  GLUCOSE 108* 96  BUN 14 14  CREATININE 0.79 0.72  CALCIUM 7.8* 8.1*   GFR: Estimated Creatinine Clearance: 75.8 mL/min (by C-G formula based on SCr of 0.72 mg/dL). Liver Function Tests: Recent Labs  Lab 12/28/20 0921  AST 20  ALT <5  ALKPHOS 92  BILITOT 0.9  PROT 6.1*  ALBUMIN 3.4*   No results for input(s): LIPASE, AMYLASE in the last 168 hours. No results for input(s): AMMONIA in the last 168 hours. Coagulation Profile: No results for input(s): INR, PROTIME in the last 168 hours. Cardiac Enzymes: No results for input(s): CKTOTAL, CKMB, CKMBINDEX, TROPONINI in the last 168 hours. BNP (last 3 results) No results for input(s): PROBNP in the last 8760 hours. HbA1C: No results for  input(s): HGBA1C in the last 72 hours. CBG: No results for input(s): GLUCAP in the last 168 hours. Lipid Profile: No results for input(s): CHOL, HDL, LDLCALC, TRIG, CHOLHDL, LDLDIRECT in the last 72 hours. Thyroid Function Tests: No results for input(s): TSH, T4TOTAL, FREET4, T3FREE, THYROIDAB in the last 72 hours. Anemia Panel: No results for input(s): VITAMINB12, FOLATE, FERRITIN, TIBC, IRON, RETICCTPCT in the last 72 hours. Sepsis Labs: Recent Labs  Lab 12/28/20 0850  LATICACIDVEN 1.7    Recent Results (from the past 240 hour(s))  Resp Panel by RT-PCR (Flu A&B, Covid)  Nasopharyngeal Swab     Status: Abnormal   Collection Time: 12/28/20 11:18 AM   Specimen: Nasopharyngeal Swab; Nasopharyngeal(NP) swabs in vial transport medium  Result Value Ref Range Status   SARS Coronavirus 2 by RT PCR NEGATIVE NEGATIVE Final    Comment: (NOTE) SARS-CoV-2 target nucleic acids are NOT DETECTED.  The SARS-CoV-2 RNA is generally detectable in upper respiratory specimens during the acute phase of infection. The lowest concentration of SARS-CoV-2 viral copies this assay can detect is 138 copies/mL. A negative result does not preclude SARS-Cov-2 infection and should not be used as the sole basis for treatment or other patient management decisions. A negative result may occur with  improper specimen collection/handling, submission of specimen other than nasopharyngeal swab, presence of viral mutation(s) within the areas targeted by this assay, and inadequate number of viral copies(<138 copies/mL). A negative result must be combined with clinical observations, patient history, and epidemiological information. The expected result is Negative.  Fact Sheet for Patients:  EntrepreneurPulse.com.au  Fact Sheet for Healthcare Providers:  IncredibleEmployment.be  This test is no t yet approved or cleared by the [place] FDA and  has been authorized for detection and/or diagnosis of SARS-CoV-2 by FDA under an Emergency Use Authorization (EUA). This EUA will remain  in effect (meaning this test can be used) for the duration of the COVID-19 declaration under Section 564(b)(1) of the Act, 21 U.S.C.section 360bbb-3(b)(1), unless the authorization is terminated  or revoked sooner.       Influenza A by PCR POSITIVE (A) NEGATIVE Final   Influenza B by PCR NEGATIVE NEGATIVE Final    Comment: (NOTE) The Xpert Xpress SARS-CoV-2/FLU/RSV plus assay is intended as an aid in the diagnosis of influenza from Nasopharyngeal swab specimens  and should not be used as a sole basis for treatment. Nasal washings and aspirates are unacceptable for Xpert Xpress SARS-CoV-2/FLU/RSV testing.  Fact Sheet for Patients: EntrepreneurPulse.com.au  Fact Sheet for Healthcare Providers: IncredibleEmployment.be  This test is not yet approved or cleared by the [place] FDA and has been authorized for detection and/or diagnosis of SARS-CoV-2 by FDA under an Emergency Use Authorization (EUA). This EUA will remain in effect (meaning this test can be used) for the duration of the COVID-19 declaration under Section 564(b)(1) of the Act, 21 U.S.C. section 360bbb-3(b)(1), unless the authorization is terminated or revoked.  Performed at [facility], [address], [place], [place] [address]          Radiology Studies: MR BRAIN WO CONTRAST  Result Date: 12/28/2020 CLINICAL DATA:  Acute neuro deficit.  Slurred speech EXAM: MRI HEAD WITHOUT CONTRAST TECHNIQUE: Multiplanar, multiecho pulse sequences of the brain and surrounding structures were obtained without intravenous contrast. COMPARISON:  CT head 12/18/2020 FINDINGS: Brain: Mild atrophy without hydrocephalus. Negative for hemorrhage or mass. Minimal chronic white matter changes Negative for acute infarct. Vascular: Decreased flow void in the distal right vertebral artery similar  to the MRI of July 09, 2013. Otherwise normal arterial flow voids at the skull base. Skull and upper cervical spine: Negative Sinuses/Orbits: Mild mucosal edema paranasal sinuses. Bilateral cataract extraction Other: None IMPRESSION: No acute abnormality. Decreased flow void distal right vertebral artery which is chronic and may be due to occlusion or decreased flow. Electronically Signed   By: Franchot Gallo M.D.   On: 12/28/2020 12:30        Scheduled Meds:  carbidopa-levodopa  1 tablet Oral QHS   carbidopa-levodopa  2 tablet Oral QID   cholecalciferol  1,000  Units Oral Daily   enoxaparin (LOVENOX) injection  40 mg Subcutaneous Q24H   fludrocortisone  0.1 mg Oral BID   haloperidol lactate  2.5 mg Intravenous Once   melatonin  10 mg Oral QHS   midodrine  5 mg Oral TID WC   multivitamin-lutein  1 capsule Oral BID   niacin  1,000 mg Oral Q0600   oseltamivir  75 mg Oral BID   psyllium  1 packet Oral Daily   timolol  1 drop Both Eyes Daily   traZODone  50 mg Oral QHS   Continuous Infusions:  methocarbamol (ROBAXIN) IV       LOS: 1 day    Time spent: 25 minutes    Sidney Ace, MD [facility]   If 7PM-7AM, please contact night-coverage  12/30/2020, 11:06 AM

## 2020-12-30 NOTE — NC FL2 (Signed)
[place] LEVEL OF CARE SCREENING TOOL     IDENTIFICATION  Patient Name: Steven Buckley Birthdate: Jun 18, 1940 Sex: male Admission Date (Current Location): 12/28/2020  [facility] and [place] Number:  Engineering geologist and Address:  [facility], [address] [place], [place] [address]      Provider Number: [id number]  Attending Physician Name and Address:  Sidney Ace, MD  Relative Name and Phone Number:  Excell, Neyland (Spouse)   [phone] (Mobile)    Current Level of Care: Hospital Recommended Level of Care: [place] Prior Approval Number:    Date Approved/Denied:   PASRR Number:    Discharge Plan:      Current Diagnoses: Patient Active Problem List   Diagnosis Date Noted   Acute metabolic encephalopathy 81/44/8185   Altered mental status 12/28/2020   Influenza A 12/28/2020   Dementia 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 ([place]) 12/28/2020   Neurogenic orthostatic hypotension ([place]) 12/28/2020    Orientation RESPIRATION BLADDER Height & Weight        Normal Continent Weight: 176 lb 5.9 oz (80 kg) Height:  5\' 11"  (180.3 cm)  BEHAVIORAL SYMPTOMS/MOOD NEUROLOGICAL BOWEL NUTRITION STATUS        Diet (regular)  AMBULATORY STATUS COMMUNICATION OF NEEDS Skin   Limited Assist Verbally Bruising                       Personal Care Assistance Level of Assistance  Bathing, Feeding, Dressing Bathing Assistance: Limited assistance Feeding assistance: Limited assistance Dressing Assistance: Limited assistance     Functional Limitations Info             SPECIAL CARE FACTORS FREQUENCY  PT (By licensed PT), OT (By licensed OT)     PT Frequency: 5 times per week OT Frequency: 5 times per week            Contractures      Additional Factors Info  Code Status, Allergies Code Status Info: DNR Allergies Info: Niacin           Current Medications (12/30/2020):   This is the current hospital active medication list Current Facility-Administered Medications  Medication Dose Route Frequency Provider Last Rate Last Admin   acetaminophen (TYLENOL) tablet 650 mg  650 mg Oral Q6H PRN Nicole Kindred A, DO   650 mg at 12/30/20 1005   artificial tears (LACRILUBE) ophthalmic ointment   Both Eyes TID PRN Nicole Kindred A, DO       carbidopa-levodopa (SINEMET CR) 50-200 MG per tablet controlled release 1 tablet  1 tablet Oral QHS Nicole Kindred A, DO   1 tablet at 12/29/20 2218   carbidopa-levodopa (SINEMET IR) 25-100 MG per tablet immediate release 2 tablet  2 tablet Oral QID Nicole Kindred A, DO   2 tablet at 12/30/20 1221   cholecalciferol (VITAMIN D) tablet 1,000 Units  1,000 Units Oral Daily Nicole Kindred A, DO   1,000 Units at 12/30/20 0844   clonazePAM (KLONOPIN) tablet 0.5 mg  0.5 mg Oral BID PRN Nicole Kindred A, DO   0.5 mg at 12/29/20 2218   diphenhydrAMINE (BENADRYL) capsule 25 mg  25 mg Oral Q6H PRN Ralene Muskrat B, MD   25 mg at 12/30/20 1230   enoxaparin (LOVENOX) injection 40 mg  40 mg Subcutaneous Q24H Ralene Muskrat B, MD   40 mg at 12/29/20 2220   fludrocortisone (FLORINEF) tablet 0.1 mg  0.1 mg Oral BID  Nicole Kindred A, DO   0.1 mg at 12/30/20 0844   guaiFENesin-dextromethorphan (ROBITUSSIN DM) 100-10 MG/5ML syrup 5 mL  5 mL Oral Q4H PRN Nicole Kindred A, DO       haloperidol lactate (HALDOL) injection 2.5 mg  2.5 mg Intravenous Once Sharion Settler, NP       ibuprofen (ADVIL) tablet 400 mg  400 mg Oral Q6H PRN Ralene Muskrat B, MD   400 mg at 12/30/20 1005   melatonin tablet 10 mg  10 mg Oral QHS Nicole Kindred A, DO   10 mg at 12/29/20 2218   methocarbamol (ROBAXIN) 500 mg in dextrose 5 % 50 mL IVPB  500 mg Intravenous Once Sharion Settler, NP       midodrine (PROAMATINE) tablet 5 mg  5 mg Oral TID WC Ralene Muskrat B, MD   5 mg at 12/30/20 1221   multivitamin-lutein (OCUVITE-LUTEIN) capsule 1 capsule  1 capsule Oral BID  Nicole Kindred A, DO   1 capsule at 12/30/20 0844   oseltamivir (TAMIFLU) capsule 75 mg  75 mg Oral BID Nicole Kindred A, DO   75 mg at 12/30/20 0844   polyvinyl alcohol (LIQUIFILM TEARS) 1.4 % ophthalmic solution 1 drop  1 drop Both Eyes PRN Nicole Kindred A, DO       psyllium (HYDROCIL/METAMUCIL) 1 packet  1 packet Oral Daily Nicole Kindred A, DO   1 packet at 12/30/20 0845   tamsulosin (FLOMAX) capsule 0.4 mg  0.4 mg Oral Daily PRN Nicole Kindred A, DO       timolol (TIMOPTIC) 0.5 % ophthalmic solution 1 drop  1 drop Both Eyes Daily Nicole Kindred A, DO       traZODone (DESYREL) tablet 50 mg  50 mg Oral QHS Sidney Ace, MD         Discharge Medications: Please see discharge summary for a list of discharge medications.  Relevant Imaging Results:  Relevant Lab Results:   Additional Information SS #: [id number]  [place], LCSW

## 2020-12-31 DIAGNOSIS — R404 Transient alteration of awareness: Secondary | ICD-10-CM | POA: Diagnosis not present

## 2020-12-31 NOTE — Progress Notes (Signed)
PROGRESS NOTE    Steven Buckley  RJJ:[id number] DOB: 04/25/1940 DOA: 12/28/2020 PCP: Rusty Aus, MD    Brief Narrative:  80 y.o. male with medical history significant of Parkinson's disease with dementia, orthostatic hypotension, urinary urgency due to neurogenic bladder, laryngopharyngeal reflux (LPR), lumbosacral spondylosis with radiculopathy presented to the ED via EMS due to altered mental status and a fall.  Wife at bedside during encounter reported patient tested positive for COVID-19 on Monday (2 days ago), received an infusion on Tuesday (unable to find record of this on chart review) and prescribed Paxlovid, Z-Pak by PCP.  She reports patient had increasing confusion last night, overnight was very restless and unable to sleep, was up and down to use the bathroom but had difficulty finding the bathroom.  Then around 3:30 AM had a fall in between their 2 twin beds and was unable to get up.  Wife reported at that time he was very minimally responsive, could not move and she thought he had had a stroke.  EMS was called to bring patient to the hospital for further evaluation  CT and MRI imaging survey negative for CVA.  Had some agitation on 11/30.  Received Haldol overnight.  More sleepy this morning.  Lengthy discussion with son and wife at bedside.  Reports progressive worsening of functional status over past several years secondary to Parkinson's.  They are interested in finding a long-term care versus rehab facility.  Patient is influenza positive likely exacerbating his underlying symptoms.  12/2: Patient much more awake this morning.  Communicating.  Sitting up in chair.  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  Wife suspected stroke.  Noncontrast head CT and brain MRI negative for acute findings.  Neuro exam is nonfocal.  Given history of orthostatic hypotension, suspect cerebral hypoperfusion was the cause.  During admission encounter, wife reports patient much improved and near baseline. 12/2: Patient much more awake.  Not agitated Plan: Continue delirium precautions Frequent neurochecks Treatment for influenza A as below Therapy evaluations TOC consulted for skilled nursing facility placement patient medically stable for discharge at this time Delirium precautions Frequent neurochecks Treat underlying causes as below Therapy evaluations, recommend skilled nursing facility [facility] consult  Influenza A infection  without signs of pneumonia on chest x-ray.   Not hypoxic on admission but SPO2 on the monitor during admission encounter would fluctuate and intermittently dropped to the upper 80s. No fevers, not septic Likely underlying his current presentation Plan: Continue Tamiflu to complete total course Supplemental oxygen if needed, currently on room air Antitussives as needed APAP as needed fever  Recent COVID positivity Patient states that he tested positive for COVID-19 on Monday 2 days prior to admission.  He apparently received an infusion on Tuesday and prescribed Paxlovid and Z-Pak by PCP.  Interestingly he tested negative for COVID here Plan: No pneumonia, no fever, no oxygen requirement No indication for remdesivir or steroids Supportive care   Fall most likely secondary to generalized weakness in the setting of above infection, orthostasis may have contributed as well.  Patient typically independent with ambulation, no assist device.  Has occasional  falls at home per family, due to gait abnormality related to Parkinson's. Plan: Fall  precautions Therapy evaluations, recommend skilled nursing facility [facility] consult   Neurogenic orthostatic hypotension  chronic, secondary to Parkinson's disease. --Continue home Florinef and midodrine   Chronic constipation secondary Parkinson's disease. --Continue with daily Metamucil   Dementia due to Parkinson's disease patient follows with neurologist, Dr. Manuella Ghazi. --Continue home Sinemet IR 4 times daily and Sinemet CR at bedtime --Continue home Klonopin as needed   Insomnia chronic per chart review --Melatonin at bedtime   Urinary urgency due to neurogenic bladder  related to Parkinson's. --Continue Flomax as needed   DVT prophylaxis: SQ Lovenox Code Status: DNR Family Communication: Wife and son at bedside 12/1, 12/2, son-in-law at bedside 12/3 Disposition Plan: Status is: Inpatient  Remains inpatient appropriate because: Acute metabolic encephalopathy in setting of Parkinson's and acute viral syndrome.  Patient now medically stable for discharge.  Will need skilled nursing facility.  Discharge pending placement            Level of care: Telemetry Medical  Consultants:  None  Procedures:  None  Antimicrobials: Tamiflu   Subjective: Seen and examined.  Much more awake this morning.  Sitting up in chair.  Answers questions appropriately.  Objective: Vitals:   12/30/20 2139 12/31/20 0005 12/31/20 0442 12/31/20 0744  BP: (!) 64/43 111/70 124/87 (!) 138/96  Pulse: 79 70 69 70  Resp:  18 16 18   Temp:  97.8 F (36.6 C) 97.7 F (36.5 C) 97.8 F (36.6 C)  TempSrc:  Oral Oral   SpO2:  96% 96% 99%  Weight:      Height:       No intake or output data in the 24 hours ending 12/31/20 1001  Filed Weights   12/28/20 1808 12/28/20 2240  Weight: 81.6 kg 80 kg    Examination:  General exam: No acute distress.  Sitting up in chair.  Answers questions appropriately Respiratory system: Lungs clear.  Normal work of breathing.  Room air Cardiovascular  system: S1-S2, RRR, no murmurs, no pedal edema Gastrointestinal system: Soft, NT/ND, normal bowel sounds Central nervous system: Alert and oriented.  No focal deficits Extremities: 5X5 power BUE, gait not assessed Skin: No rashes, lesions or ulcers Psychiatry: Judgement and insight appear impaired. Mood & affect flattened.     Data Reviewed: I have personally reviewed following labs and imaging studies  CBC: Recent Labs  Lab 12/28/20 0921 12/29/20 0812  WBC 6.3 4.4  HGB 13.2 13.6  HCT 39.4 40.7  MCV 94.7 94.2  PLT 129* 425*   Basic Metabolic Panel: Recent Labs  Lab 12/28/20 0921 12/29/20 0812  NA 136 137  K 3.6 3.4*  CL 105 102  CO2 25 28  GLUCOSE 108* 96  BUN 14 14  CREATININE 0.79 0.72  CALCIUM 7.8* 8.1*   GFR: Estimated Creatinine Clearance: 75.8 mL/min (by C-G formula based on SCr of 0.72 mg/dL). Liver Function Tests: Recent Labs  Lab 12/28/20 0921  AST 20  ALT <5  ALKPHOS 92  BILITOT 0.9  PROT 6.1*  ALBUMIN 3.4*   No results for input(s): LIPASE, AMYLASE in the last 168 hours. No results for input(s): AMMONIA in the last 168 hours. Coagulation Profile: No results for input(s): INR, PROTIME in the last 168 hours. Cardiac Enzymes: No results for input(s): CKTOTAL, CKMB, CKMBINDEX, TROPONINI in the last 168 hours. BNP (last 3 results) No results for input(s): PROBNP in the last 8760 hours. HbA1C: No results for  input(s): HGBA1C in the last 72 hours. CBG: No results for input(s): GLUCAP in the last 168 hours. Lipid Profile: No results for input(s): CHOL, HDL, LDLCALC, TRIG, CHOLHDL, LDLDIRECT in the last 72 hours. Thyroid Function Tests: No results for input(s): TSH, T4TOTAL, FREET4, T3FREE, THYROIDAB in the last 72 hours. Anemia Panel: No results for input(s): VITAMINB12, FOLATE, FERRITIN, TIBC, IRON, RETICCTPCT in the last 72 hours. Sepsis Labs: Recent Labs  Lab 12/28/20 0850  LATICACIDVEN 1.7    Recent Results (from the past 240 hour(s))   Resp Panel by RT-PCR (Flu A&B, Covid) Nasopharyngeal Swab     Status: Abnormal   Collection Time: 12/28/20 11:18 AM   Specimen: Nasopharyngeal Swab; Nasopharyngeal(NP) swabs in vial transport medium  Result Value Ref Range Status   SARS Coronavirus 2 by RT PCR NEGATIVE NEGATIVE Final    Comment: (NOTE) SARS-CoV-2 target nucleic acids are NOT DETECTED.  The SARS-CoV-2 RNA is generally detectable in upper respiratory specimens during the acute phase of infection. The lowest concentration of SARS-CoV-2 viral copies this assay can detect is 138 copies/mL. A negative result does not preclude SARS-Cov-2 infection and should not be used as the sole basis for treatment or other patient management decisions. A negative result may occur with  improper specimen collection/handling, submission of specimen other than nasopharyngeal swab, presence of viral mutation(s) within the areas targeted by this assay, and inadequate number of viral copies(<138 copies/mL). A negative result must be combined with clinical observations, patient history, and epidemiological information. The expected result is Negative.  Fact Sheet for Patients:  EntrepreneurPulse.com.au  Fact Sheet for Healthcare Providers:  IncredibleEmployment.be  This test is no t yet approved or cleared by the [place] FDA and  has been authorized for detection and/or diagnosis of SARS-CoV-2 by FDA under an Emergency Use Authorization (EUA). This EUA will remain  in effect (meaning this test can be used) for the duration of the COVID-19 declaration under Section 564(b)(1) of the Act, 21 U.S.C.section 360bbb-3(b)(1), unless the authorization is terminated  or revoked sooner.       Influenza A by PCR POSITIVE (A) NEGATIVE Final   Influenza B by PCR NEGATIVE NEGATIVE Final    Comment: (NOTE) The Xpert Xpress SARS-CoV-2/FLU/RSV plus assay is intended as an aid in the diagnosis of influenza from  Nasopharyngeal swab specimens and should not be used as a sole basis for treatment. Nasal washings and aspirates are unacceptable for Xpert Xpress SARS-CoV-2/FLU/RSV testing.  Fact Sheet for Patients: EntrepreneurPulse.com.au  Fact Sheet for Healthcare Providers: IncredibleEmployment.be  This test is not yet approved or cleared by the [place] FDA and has been authorized for detection and/or diagnosis of SARS-CoV-2 by FDA under an Emergency Use Authorization (EUA). This EUA will remain in effect (meaning this test can be used) for the duration of the COVID-19 declaration under Section 564(b)(1) of the Act, 21 U.S.C. section 360bbb-3(b)(1), unless the authorization is terminated or revoked.  Performed at [facility], [address], [place], [place] [address]          Radiology Studies: No results found.      Scheduled Meds:  carbidopa-levodopa  1 tablet Oral QHS   carbidopa-levodopa  2 tablet Oral QID   cholecalciferol  1,000 Units Oral Daily   enoxaparin (LOVENOX) injection  40 mg Subcutaneous Q24H   fludrocortisone  0.1 mg Oral BID   haloperidol lactate  2.5 mg Intravenous Once   melatonin  10 mg Oral QHS   midodrine  5 mg  Oral TID WC   multivitamin-lutein  1 capsule Oral BID   oseltamivir  75 mg Oral BID   psyllium  1 packet Oral Daily   timolol  1 drop Both Eyes Daily   traZODone  50 mg Oral QHS   Continuous Infusions:  methocarbamol (ROBAXIN) IV       LOS: 2 days    Time spent: 15 minutes    Sidney Ace, MD [facility]   If 7PM-7AM, please contact night-coverage  12/31/2020, 10:01 AM

## 2021-01-01 DIAGNOSIS — R404 Transient alteration of awareness: Secondary | ICD-10-CM | POA: Diagnosis not present

## 2021-01-01 MED ORDER — POLYVINYL ALCOHOL 1.4 % OP SOLN
2.0000 [drp] | OPHTHALMIC | Status: DC | PRN
  Filled 2021-01-01: qty 15

## 2021-01-01 NOTE — TOC Progression Note (Addendum)
Transition of Care [facility]) - Progression Note    Patient Details  Name: Steven Buckley MRN: [id number] Date of Birth: January 08, 1941  Transition of Care [facility]) CM/SW Contact  Zigmund Daniel Dorian Pod, RN Phone Number:[phone] 01/01/2021, 3:41 PM  Clinical Narrative:     RN contacted the pt's spouse Ander Gaster) today with update that no beds have been offered.  Spouse haas indicated that she only prefers [facility] and [facility].   TOC will continue to follow up accordingly with any offers.     Expected Discharge Plan: [place] Barriers to Discharge: Continued Medical Work up  Expected Discharge Plan and Services Expected Discharge Plan: [place]   Discharge Planning Services: CM Consult Post Acute Care Choice: [place] Living arrangements for the past 2 months: Single Family Home                 DME Arranged: N/A DME Agency: NA       HH Arranged: NA           Social Determinants of Health (SDOH) Interventions    Readmission Risk Interventions No flowsheet data found.

## 2021-01-01 NOTE — Progress Notes (Signed)
PROGRESS NOTE    Steven Buckley  OJJ:[id number] DOB: [birth year] DOA: 12/28/2020 PCP: Steven Aus, MD    Brief Narrative:   80 y.o. male with medical history significant of Parkinson's disease with dementia, orthostatic hypotension, urinary urgency due to neurogenic bladder, laryngopharyngeal reflux (LPR), lumbosacral spondylosis with radiculopathy presented to the ED via EMS due to altered mental status and a fall.  Wife at bedside during encounter reported patient tested positive for COVID-19 on Monday (2 days ago), received an infusion on Tuesday (unable to find record of this on chart review) and prescribed Paxlovid, Z-Pak by PCP.  She reports patient had increasing confusion last night, overnight was very restless and unable to sleep, was up and down to use the bathroom but had difficulty finding the bathroom.  Then around 3:30 AM had a fall in between their 2 twin beds and was unable to get up.  Wife reported at that time he was very minimally responsive, could not move and she thought he had had a stroke.  EMS was called to bring patient to the hospital for further evaluation  CT and MRI imaging survey negative for CVA.  Had some agitation on 11/30.  Received Haldol overnight.  More sleepy this morning.  Lengthy discussion with son and wife at bedside.  Reports progressive worsening of functional status over past several years secondary to Parkinson's.  They are interested in finding a long-term care versus rehab facility.  Patient is influenza positive likely exacerbating his underlying symptoms.  12/2: Patient much more awake this morning.  Communicating.  Sitting up in chair.  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  Wife suspected stroke.  Noncontrast head CT and brain MRI negative for acute findings.  Neuro exam is nonfocal.  Given history of orthostatic hypotension, suspect cerebral hypoperfusion was the cause.  During admission encounter, wife reports patient much improved and near baseline. 12/2: Patient much more awake.  Not agitated Plan: Continue delirium precautions Frequent neurochecks Treatment for influenza A as below Therapy evaluations TOC consulted for skilled nursing facility placement patient medically stable for discharge at this time Delirium precautions Frequent neurochecks Treat underlying causes as below Therapy evaluations, recommend skilled nursing facility [facility] consult  Influenza A infection  without signs of pneumonia on chest x-ray.   Not hypoxic on admission but SPO2 on the monitor during admission encounter would fluctuate and intermittently dropped to the upper 80s. No fevers, not septic Likely underlying his current presentation Plan: Continue Tamiflu to complete total course Supplemental oxygen if needed, currently on room air Antitussives as needed APAP as needed fever  Recent COVID positivity Patient states that he tested positive for COVID-19 on Monday 2 days prior to admission.  He apparently received an infusion on Tuesday and prescribed Paxlovid and Z-Pak by PCP.  Interestingly he tested negative for COVID here Plan: No pneumonia, no fever, no oxygen requirement No indication for remdesivir or steroids Supportive care   Fall most likely secondary to generalized weakness in the setting of above infection, orthostasis may have contributed as well.  Patient typically independent with ambulation, no assist device.  Has  occasional falls at home per family, due to gait abnormality related to Parkinson's. Plan: Fall  precautions Therapy evaluations, recommend skilled nursing facility [facility] consult   Neurogenic orthostatic hypotension  chronic, secondary to Parkinson's disease. --Continue home Florinef and midodrine   Chronic constipation secondary Parkinson's disease. --Continue with daily Metamucil   Dementia due to Parkinson's disease patient follows with neurologist, Steven Buckley. --Continue home Sinemet IR 4 times daily and Sinemet CR at bedtime --Continue home Klonopin as needed   Insomnia chronic per chart review --Melatonin at bedtime   Urinary urgency due to neurogenic bladder  related to Parkinson's. --Continue Flomax as needed   DVT prophylaxis: SQ Lovenox Code Status: DNR Family Communication: Wife and son at bedside 12/1, 12/2, son-in-law at bedside 12/3.  Daughter at bedside 12/4 Disposition Plan: Status is: Inpatient  Remains inpatient appropriate because: Acute metabolic encephalopathy in setting of Parkinson's and acute viral syndrome.  Patient now medically stable for discharge.  Will need skilled nursing facility.  Discharge pending placement  Level of care: Telemetry Medical  Consultants:  None  Procedures:  None  Antimicrobials: Tamiflu   Subjective: Seen and examined.  Mental status intact.  Answers questions appropriately.  Sitting up in bed eating breakfast.  Objective: Vitals:   12/31/20 2018 12/31/20 2333 01/01/21 0404 01/01/21 0820  BP: (!) 127/92 140/77 137/89 (!) 154/99  Pulse: 71 69 70 64  Resp:  18 16 16   Temp:  97.9 F (36.6 C) 97.8 F (36.6 C) 97.8 F (36.6 C)  TempSrc:  Oral Oral Oral  SpO2:  97% 95% 98%  Weight:      Height:       No intake or output data in the 24 hours ending 01/01/21 1018  Filed Weights   12/28/20 1808 12/28/20 2240  Weight: 81.6 kg 80 kg    Examination:  General exam: No acute distress.  Sitting up in bed eating breakfast.  Answers questions appropriately. Respiratory system: Lungs clear.  Normal work of  breathing.  Room air Cardiovascular system: S1-S2, RRR, no murmurs, no pedal edema Gastrointestinal system: Soft, NT/ND, normal bowel sounds Central nervous system: Alert and oriented.  No focal deficits Extremities: 5X5 power BUE, gait not assessed Skin: No rashes, lesions or ulcers Psychiatry: Judgement and insight appear impaired. Mood & affect flattened.     Data Reviewed: I have personally reviewed following labs and imaging studies  CBC: Recent Labs  Lab 12/28/20 0921 12/29/20 0812  WBC 6.3 4.4  HGB 13.2 13.6  HCT 39.4 40.7  MCV 94.7 94.2  PLT 129* 915*   Basic Metabolic Panel: Recent Labs  Lab 12/28/20 0921 12/29/20 0812  NA 136 137  K 3.6 3.4*  CL 105 102  CO2 25 28  GLUCOSE 108* 96  BUN 14 14  CREATININE 0.79 0.72  CALCIUM 7.8* 8.1*   GFR: Estimated Creatinine Clearance: 75.8 mL/min (by C-G formula based on SCr of 0.72 mg/dL). Liver Function Tests: Recent Labs  Lab 12/28/20 0921  AST 20  ALT <5  ALKPHOS 92  BILITOT 0.9  PROT 6.1*  ALBUMIN 3.4*   No results for input(s): LIPASE, AMYLASE in the last 168 hours. No results for input(s): AMMONIA in the last 168 hours. Coagulation Profile: No results for input(s): INR, PROTIME in the last 168 hours. Cardiac Enzymes: No results for input(s): CKTOTAL, CKMB, CKMBINDEX, TROPONINI in the last 168 hours. BNP (last 3 results) No results for input(s): PROBNP in the last 8760 hours. HbA1C: No results for input(s): HGBA1C  in the last 72 hours. CBG: No results for input(s): GLUCAP in the last 168 hours. Lipid Profile: No results for input(s): CHOL, HDL, LDLCALC, TRIG, CHOLHDL, LDLDIRECT in the last 72 hours. Thyroid Function Tests: No results for input(s): TSH, T4TOTAL, FREET4, T3FREE, THYROIDAB in the last 72 hours. Anemia Panel: No results for input(s): VITAMINB12, FOLATE, FERRITIN, TIBC, IRON, RETICCTPCT in the last 72 hours. Sepsis Labs: Recent Labs  Lab 12/28/20 0850  LATICACIDVEN 1.7    Recent  Results (from the past 240 hour(s))  Resp Panel by RT-PCR (Flu A&B, Covid) Nasopharyngeal Swab     Status: Abnormal   Collection Time: 12/28/20 11:18 AM   Specimen: Nasopharyngeal Swab; Nasopharyngeal(NP) swabs in vial transport medium  Result Value Ref Range Status   SARS Coronavirus 2 by RT PCR NEGATIVE NEGATIVE Final    Comment: (NOTE) SARS-CoV-2 target nucleic acids are NOT DETECTED.  The SARS-CoV-2 RNA is generally detectable in upper respiratory specimens during the acute phase of infection. The lowest concentration of SARS-CoV-2 viral copies this assay can detect is 138 copies/mL. A negative result does not preclude SARS-Cov-2 infection and should not be used as the sole basis for treatment or other patient management decisions. A negative result may occur with  improper specimen collection/handling, submission of specimen other than nasopharyngeal swab, presence of viral mutation(s) within the areas targeted by this assay, and inadequate number of viral copies(<138 copies/mL). A negative result must be combined with clinical observations, patient history, and epidemiological information. The expected result is Negative.  Fact Sheet for Patients:  EntrepreneurPulse.com.au  Fact Sheet for Healthcare Providers:  IncredibleEmployment.be  This test is no t yet approved or cleared by the [place] FDA and  has been authorized for detection and/or diagnosis of SARS-CoV-2 by FDA under an Emergency Use Authorization (EUA). This EUA will remain  in effect (meaning this test can be used) for the duration of the COVID-19 declaration under Section 564(b)(1) of the Act, 21 U.S.C.section 360bbb-3(b)(1), unless the authorization is terminated  or revoked sooner.       Influenza A by PCR POSITIVE (A) NEGATIVE Final   Influenza B by PCR NEGATIVE NEGATIVE Final    Comment: (NOTE) The Xpert Xpress SARS-CoV-2/FLU/RSV plus assay is intended as an  aid in the diagnosis of influenza from Nasopharyngeal swab specimens and should not be used as a sole basis for treatment. Nasal washings and aspirates are unacceptable for Xpert Xpress SARS-CoV-2/FLU/RSV testing.  Fact Sheet for Patients: EntrepreneurPulse.com.au  Fact Sheet for Healthcare Providers: IncredibleEmployment.be  This test is not yet approved or cleared by the [place] FDA and has been authorized for detection and/or diagnosis of SARS-CoV-2 by FDA under an Emergency Use Authorization (EUA). This EUA will remain in effect (meaning this test can be used) for the duration of the COVID-19 declaration under Section 564(b)(1) of the Act, 21 U.S.C. section 360bbb-3(b)(1), unless the authorization is terminated or revoked.  Performed at [facility], [address], [place], [place] [address]          Radiology Studies: No results found.      Scheduled Meds:  carbidopa-levodopa  1 tablet Oral QHS   carbidopa-levodopa  2 tablet Oral QID   cholecalciferol  1,000 Units Oral Daily   enoxaparin (LOVENOX) injection  40 mg Subcutaneous Q24H   fludrocortisone  0.1 mg Oral BID   haloperidol lactate  2.5 mg Intravenous Once   melatonin  10 mg Oral QHS   midodrine  5 mg Oral TID  WC   multivitamin-lutein  1 capsule Oral BID   oseltamivir  75 mg Oral BID   psyllium  1 packet Oral Daily   timolol  1 drop Both Eyes Daily   traZODone  50 mg Oral QHS   Continuous Infusions:  methocarbamol (ROBAXIN) IV       LOS: 3 days    Time spent: 15 minutes    Sidney Ace, MD [facility]   If 7PM-7AM, please contact night-coverage  01/01/2021, 10:18 AM

## 2021-01-02 DIAGNOSIS — R404 Transient alteration of awareness: Secondary | ICD-10-CM | POA: Diagnosis not present

## 2021-01-02 NOTE — Progress Notes (Signed)
Occupational Therapy Treatment Patient Details Name: Steven Buckley MRN: [id number] DOB: December 06, 1940 Today's Date: 01/02/2021   History of present illness 80 y.o. male with medical history significant of Parkinson's disease with dementia, orthostatic hypotension, urinary urgency due to neurogenic bladder, laryngopharyngeal reflux (LPR), lumbosacral spondylosis with radiculopathy presented to the ED via EMS due to altered mental status and a fall.  Wife at bedside during encounter reported patient tested positive for COVID-19 on Monday (2 days ago), received an infusion on Tuesday (unable to find record of this on chart review) and prescribed Paxlovid, Z-Pak by PCP. Pt also positive for flu.   OT comments  Upon entering the room, pt supine in bed with wife and daughter present in the room. Pt is agreeable to OT intervention and is motivated for therapeutic intervention. Pt is oriented to self, situation, time, and location. He stands with supervision and ambulates with RW with close supervision 200' while stopping to pick up objects place on the floor without LOB. Pt taking seated rest break on low furniture with cuing for technique. Pt needing 2 attempts to stand but able to shift weight to stand without physical assistance. Pt ambulating back to room in same manner as above. Pt side stepping with RW along EOB and returns to bed and end of session. Therapist discussed pt's progress this session and recommendation for home with home health. She does express concerns but with pt's continued progress HH is recommended intervention at discharge.    Recommendations for follow up therapy are one component of a multi-disciplinary discharge planning process, led by the attending physician.  Recommendations may be updated based on patient status, additional functional criteria and insurance authorization.    Follow Up Recommendations  Home health OT    Assistance Recommended at Discharge Intermittent  Supervision/Assistance  Equipment Recommendations  None recommended by OT       Precautions / Restrictions Precautions Precautions: Fall Precaution Comments: droplet Restrictions Weight Bearing Restrictions: No       Mobility Bed Mobility Overal bed mobility: Needs Assistance Bed Mobility: Supine to Sit;Sit to Supine     Supine to sit: Supervision Sit to supine: Supervision   General bed mobility comments: no physical assistance needed    Transfers Overall transfer level: Needs assistance Equipment used: Rolling walker (2 wheels) Transfers: Sit to/from Stand Sit to Stand: Supervision Stand pivot transfers: Supervision Step pivot transfers: Supervision       General transfer comment: cuing for technique for anterior weight shift.     Balance Overall balance assessment: Needs assistance Sitting-balance support: Feet supported Sitting balance-Leahy Scale: Good     Standing balance support: During functional activity Standing balance-Leahy Scale: Fair                             ADL either performed or assessed with clinical judgement   ADL Overall ADL's : Needs assistance/impaired                     Lower Body Dressing: Supervision/safety;Sitting/lateral leans Lower Body Dressing Details (indicate cue type and reason): B socks Toilet Transfer: Supervision/safety;Rolling walker (2 wheels) Toilet Transfer Details (indicate cue type and reason): simulated                Extremity/Trunk Assessment Upper Extremity Assessment Upper Extremity Assessment: Generalized weakness            Vision Patient Visual Report: No change from baseline  Cognition Arousal/Alertness: Awake/alert Behavior During Therapy: WFL for tasks assessed/performed Overall Cognitive Status: History of cognitive impairments - at baseline                                 General Comments: Pt's family report that his current  cognition is different than his baseline. Pt is oriented to self, location, and month.          Exercises Other Exercises Other Exercises: standing ex with and without UE support and 10 x sit to stand           Pertinent Vitals/ Pain       Pain Assessment: No/denies pain         Frequency  Min 2X/week        Progress Toward Goals  OT Goals(current goals can now be found in the care plan section)  Progress towards OT goals: Progressing toward goals  Acute Rehab OT Goals Patient Stated Goal: to get better OT Goal Formulation: With family Time For Goal Achievement: 01/12/21 Potential to Achieve Goals: Good  Plan Frequency remains appropriate;Discharge plan needs to be updated       AM-PAC OT "6 Clicks" Daily Activity     Outcome Measure   Help from another person eating meals?: None Help from another person taking care of personal grooming?: A Little Help from another person toileting, which includes using toliet, bedpan, or urinal?: A Little Help from another person bathing (including washing, rinsing, drying)?: A Little Help from another person to put on and taking off regular upper body clothing?: A Little Help from another person to put on and taking off regular lower body clothing?: A Little 6 Click Score: 19    End of Session    OT Visit Diagnosis: Unsteadiness on feet (R26.81);Repeated falls (R29.6);Muscle weakness (generalized) (M62.81);History of falling (Z91.81)   Activity Tolerance Patient tolerated treatment well   Patient Left in bed;with call bell/phone within reach;with bed alarm set;with family/visitor present   Nurse Communication Mobility status        Time: 6767-2094 OT Time Calculation (min): 27 min  Charges: OT General Charges $OT Visit: 1 Visit OT Treatments $Therapeutic Activity: 23-37 mins  Darleen Crocker, MS, OTR/L , CBIS ascom [phone]  01/02/21, 4:26 PM

## 2021-01-02 NOTE — Progress Notes (Signed)
PROGRESS NOTE    Steven Buckley  XNA:[id number] DOB: [birth year] DOA: 12/28/2020 PCP: Rusty Aus, MD    Brief Narrative:   80 y.o. male with medical history significant of Parkinson's disease with dementia, orthostatic hypotension, urinary urgency due to neurogenic bladder, laryngopharyngeal reflux (LPR), lumbosacral spondylosis with radiculopathy presented to the ED via EMS due to altered mental status and a fall.  Wife at bedside during encounter reported patient tested positive for COVID-19 on Monday (2 days ago), received an infusion on Tuesday (unable to find record of this on chart review) and prescribed Paxlovid, Z-Pak by PCP.  She reports patient had increasing confusion last night, overnight was very restless and unable to sleep, was up and down to use the bathroom but had difficulty finding the bathroom.  Then around 3:30 AM had a fall in between their 2 twin beds and was unable to get up.  Wife reported at that time he was very minimally responsive, could not move and she thought he had had a stroke.  EMS was called to bring patient to the hospital for further evaluation  CT and MRI imaging survey negative for CVA.  Had some agitation on 11/30.  Received Haldol overnight.  More sleepy this morning.  Lengthy discussion with son and wife at bedside.  Reports progressive worsening of functional status over past several years secondary to Parkinson's.  They are interested in finding a long-term care versus rehab facility.  Patient is influenza positive likely exacerbating his underlying symptoms.  12/2: Patient much more awake this morning.  Communicating.  Sitting up in chair.  Wife and son at bedside  12/5: Mental status has remained at baseline.  Patient alert and communicative.  TOC informs that no bed offers have been made.  Spouse indicates prefers [facility]   Assessment & Plan:   Principal Problem:   Altered mental status Active Problems:   Influenza A    Dementia 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 (HCC)   Neurogenic orthostatic hypotension (HCC)   Acute metabolic encephalopathy  Acute metabolic encephalopathy Decreased level of consciousness wife described as significantly reduced responsiveness and inability to move after a fall overnight.  Wife suspected stroke.  Noncontrast head CT and brain MRI negative for acute findings.  Neuro exam is nonfocal.  Given history of orthostatic hypotension, suspect cerebral hypoperfusion was the cause.  During admission encounter, wife reports patient much improved and near baseline. 12/2: Patient much more awake.  Not agitated Plan: Continue delirium precautions Frequent neurochecks Treatment for influenza A as below Therapy evaluations TOC consulted for skilled nursing facility placement patient medically stable for discharge at this time Delirium precautions Frequent neurochecks Treat underlying causes as below Therapy evaluations, recommend skilled nursing facility [facility] consult No bed offers as of 12/5  Influenza A infection  without signs of pneumonia on chest x-ray.   Not hypoxic on admission but SPO2 on the monitor during admission encounter would fluctuate and intermittently dropped to the upper 80s. No fevers, not septic Likely underlying his current presentation Plan: Completed 5-day course of Tamiflu Supplemental oxygen if needed, currently on room air Antitussives as needed APAP as needed fever  Recent COVID positivity Patient states that he tested positive for COVID-19 on Monday 2 days prior to admission.  He apparently received an infusion on Tuesday and prescribed Paxlovid and Z-Pak by PCP.  Interestingly he tested negative for COVID here Plan: No pneumonia, no fever, no oxygen requirement No indication for remdesivir  or steroids Supportive care   Fall most likely secondary to generalized weakness in the setting of above infection, orthostasis may have  contributed as well.  Patient typically independent with ambulation, no assist device.  Has occasional falls at home per family, due to gait abnormality related to Parkinson's. Plan: Fall precautions Therapy evaluations, recommend skilled nursing facility [facility] consult   Neurogenic orthostatic hypotension  chronic, secondary to Parkinson's disease. --Continue home Florinef and midodrine   Chronic constipation secondary Parkinson's disease. --Continue with daily Metamucil   Dementia due to Parkinson's disease patient follows with neurologist, Dr. Manuella Ghazi. --Continue home Sinemet IR 4 times daily and Sinemet CR at bedtime --Continue home Klonopin as needed   Insomnia chronic per chart review --Melatonin at bedtime   Urinary urgency due to neurogenic bladder  related to Parkinson's. --Continue Flomax as needed   DVT prophylaxis: SQ Lovenox Code Status: DNR Family Communication: Wife and son at bedside 12/1, 12/2, son-in-law at bedside 12/3.  Daughter at bedside 12/4, 12/5 Disposition Plan: Status is: Inpatient  Remains inpatient appropriate because: Acute metabolic encephalopathy in setting of Parkinson's and acute viral syndrome.  Patient is medically stable for discharge.  Need SNF.  Pending placement  Level of care: Telemetry Medical  Consultants:  None  Procedures:  None  Antimicrobials: Tamiflu   Subjective: Seen and examined.  Mental status intact.  Answers questions appropriately.  Sitting up in bed eating breakfast.  Objective: Vitals:   01/01/21 1645 01/01/21 2154 01/02/21 0518 01/02/21 0829  BP: 115/75 125/83 (!) 152/94 (!) 175/103  Pulse: 82 76 64 69  Resp: 17 16 14 18   Temp: 98 F (36.7 C) (!) 97.4 F (36.3 C) 97.8 F (36.6 C) 98.4 F (36.9 C)  TempSrc: Oral Oral Oral   SpO2:  98% 98% 99%  Weight:      Height:       No intake or output data in the 24 hours ending 01/02/21 1200  Filed Weights   12/28/20 1808 12/28/20 2240  Weight: 81.6 kg 80 kg     Examination:  General exam: No acute distress.  Sitting up in bed eating breakfast.  Answers questions appropriately. Respiratory system: Lungs clear.  Normal work of breathing.  Room air Cardiovascular system: S1-S2, RRR, no murmurs, no pedal edema Gastrointestinal system: Soft, NT/ND, normal bowel sounds Central nervous system: Alert and oriented.  No focal deficits Extremities: 5X5 power BUE, gait not assessed Skin: No rashes, lesions or ulcers Psychiatry: Judgement and insight appear impaired. Mood & affect flattened.     Data Reviewed: I have personally reviewed following labs and imaging studies  CBC: Recent Labs  Lab 12/28/20 0921 12/29/20 0812  WBC 6.3 4.4  HGB 13.2 13.6  HCT 39.4 40.7  MCV 94.7 94.2  PLT 129* 161*   Basic Metabolic Panel: Recent Labs  Lab 12/28/20 0921 12/29/20 0812  NA 136 137  K 3.6 3.4*  CL 105 102  CO2 25 28  GLUCOSE 108* 96  BUN 14 14  CREATININE 0.79 0.72  CALCIUM 7.8* 8.1*   GFR: Estimated Creatinine Clearance: 75.8 mL/min (by C-G formula based on SCr of 0.72 mg/dL). Liver Function Tests: Recent Labs  Lab 12/28/20 0921  AST 20  ALT <5  ALKPHOS 92  BILITOT 0.9  PROT 6.1*  ALBUMIN 3.4*   No results for input(s): LIPASE, AMYLASE in the last 168 hours. No results for input(s): AMMONIA in the last 168 hours. Coagulation Profile: No results for input(s): INR, PROTIME in the last  168 hours. Cardiac Enzymes: No results for input(s): CKTOTAL, CKMB, CKMBINDEX, TROPONINI in the last 168 hours. BNP (last 3 results) No results for input(s): PROBNP in the last 8760 hours. HbA1C: No results for input(s): HGBA1C in the last 72 hours. CBG: No results for input(s): GLUCAP in the last 168 hours. Lipid Profile: No results for input(s): CHOL, HDL, LDLCALC, TRIG, CHOLHDL, LDLDIRECT in the last 72 hours. Thyroid Function Tests: No results for input(s): TSH, T4TOTAL, FREET4, T3FREE, THYROIDAB in the last 72 hours. Anemia Panel: No  results for input(s): VITAMINB12, FOLATE, FERRITIN, TIBC, IRON, RETICCTPCT in the last 72 hours. Sepsis Labs: Recent Labs  Lab 12/28/20 0850  LATICACIDVEN 1.7    Recent Results (from the past 240 hour(s))  Resp Panel by RT-PCR (Flu A&B, Covid) Nasopharyngeal Swab     Status: Abnormal   Collection Time: 12/28/20 11:18 AM   Specimen: Nasopharyngeal Swab; Nasopharyngeal(NP) swabs in vial transport medium  Result Value Ref Range Status   SARS Coronavirus 2 by RT PCR NEGATIVE NEGATIVE Final    Comment: (NOTE) SARS-CoV-2 target nucleic acids are NOT DETECTED.  The SARS-CoV-2 RNA is generally detectable in upper respiratory specimens during the acute phase of infection. The lowest concentration of SARS-CoV-2 viral copies this assay can detect is 138 copies/mL. A negative result does not preclude SARS-Cov-2 infection and should not be used as the sole basis for treatment or other patient management decisions. A negative result may occur with  improper specimen collection/handling, submission of specimen other than nasopharyngeal swab, presence of viral mutation(s) within the areas targeted by this assay, and inadequate number of viral copies(<138 copies/mL). A negative result must be combined with clinical observations, patient history, and epidemiological information. The expected result is Negative.  Fact Sheet for Patients:  EntrepreneurPulse.com.au  Fact Sheet for Healthcare Providers:  IncredibleEmployment.be  This test is no t yet approved or cleared by the [place] FDA and  has been authorized for detection and/or diagnosis of SARS-CoV-2 by FDA under an Emergency Use Authorization (EUA). This EUA will remain  in effect (meaning this test can be used) for the duration of the COVID-19 declaration under Section 564(b)(1) of the Act, 21 U.S.C.section 360bbb-3(b)(1), unless the authorization is terminated  or revoked sooner.        Influenza A by PCR POSITIVE (A) NEGATIVE Final   Influenza B by PCR NEGATIVE NEGATIVE Final    Comment: (NOTE) The Xpert Xpress SARS-CoV-2/FLU/RSV plus assay is intended as an aid in the diagnosis of influenza from Nasopharyngeal swab specimens and should not be used as a sole basis for treatment. Nasal washings and aspirates are unacceptable for Xpert Xpress SARS-CoV-2/FLU/RSV testing.  Fact Sheet for Patients: EntrepreneurPulse.com.au  Fact Sheet for Healthcare Providers: IncredibleEmployment.be  This test is not yet approved or cleared by the [place] FDA and has been authorized for detection and/or diagnosis of SARS-CoV-2 by FDA under an Emergency Use Authorization (EUA). This EUA will remain in effect (meaning this test can be used) for the duration of the COVID-19 declaration under Section 564(b)(1) of the Act, 21 U.S.C. section 360bbb-3(b)(1), unless the authorization is terminated or revoked.  Performed at [facility], [address], [place], [place] [address]          Radiology Studies: No results found.      Scheduled Meds:  carbidopa-levodopa  1 tablet Oral QHS   carbidopa-levodopa  2 tablet Oral QID   cholecalciferol  1,000 Units Oral Daily   enoxaparin (LOVENOX) injection  40 mg Subcutaneous Q24H   fludrocortisone  0.1 mg Oral BID   haloperidol lactate  2.5 mg Intravenous Once   melatonin  10 mg Oral QHS   midodrine  5 mg Oral TID WC   multivitamin-lutein  1 capsule Oral BID   psyllium  1 packet Oral Daily   timolol  1 drop Both Eyes Daily   traZODone  50 mg Oral QHS   Continuous Infusions:  methocarbamol (ROBAXIN) IV       LOS: 4 days    Time spent: 15 minutes    Sidney Ace, MD [facility]   If 7PM-7AM, please contact night-coverage  01/02/2021, 12:00 PM

## 2021-01-02 NOTE — Progress Notes (Addendum)
Physical Therapy Treatment Patient Details Name: Steven Buckley MRN: [id number] DOB: 15-May-1940 Today's Date: 01/02/2021   History of Present Illness 80 y.o. male with medical history significant of Parkinson's disease with dementia, orthostatic hypotension, urinary urgency due to neurogenic bladder, laryngopharyngeal reflux (LPR), lumbosacral spondylosis with radiculopathy presented to the ED via EMS due to altered mental status and a fall.  Wife at bedside during encounter reported patient tested positive for COVID-19 on Monday (2 days ago), received an infusion on Tuesday (unable to find record of this on chart review) and prescribed Paxlovid, Z-Pak by PCP. Pt also positive for flu.    PT Comments    Pt up in chair, is able to walk x 2 laps on unit with RW and min guard, complete standing ex and 10x sit to stand with min guard.  Pt is progressing well with mobility and overall activity tolerance.  He continues to need +1 assist for general safety for balance, safety cues and some navigation due to decreased vision.  Discussed with pt and daughter in regards to overall improvement and SNF vs HHPT.  Daughter reports he does have services in the home along with a day program that he attends a few days a week.  She voiced concern if wife could care for him on the other times.  Encouraged daughter to discuss with pt, wife and care team to see if care could be increased to 24 hours as that would be appropriate even going forward after SNF per discussion with family.   Pt's wife to Microbiologist when she gets in and discuss plan as there seems to be some resistance to him discharging home.  Discussed with wife after lunch regarding progress.  She will discuss with family discharge plan.  She stated she would only consider [facility] and [facility].  She is encouraged to have a back up plan for discharge if he does not receive insurance auth or choices are not available.     Recommendations for  follow up therapy are one component of a multi-disciplinary discharge planning process, led by the attending physician.  Recommendations may be updated based on patient status, additional functional criteria and insurance authorization.  Follow Up Recommendations  Skilled nursing-short term rehab (<3 hours/day)     Assistance Recommended at Discharge Frequent or constant Supervision/Assistance  Equipment Recommendations  Rolling walker (2 wheels)    Recommendations for Other Services       Precautions / Restrictions Precautions Precautions: Fall Precaution Comments: droplet Restrictions Weight Bearing Restrictions: No     Mobility  Bed Mobility               General bed mobility comments: in recliner before and after    Transfers Overall transfer level: Needs assistance Equipment used: Rolling walker (2 wheels) Transfers: Sit to/from Stand Sit to Stand: Min guard                Ambulation/Gait Ambulation/Gait assistance: Min guard Gait Distance (Feet): 500 Feet Assistive device: Rolling walker (2 wheels) Gait Pattern/deviations: Step-through pattern Gait velocity: good speed         Stairs             Wheelchair Mobility    Modified Rankin (Stroke Patients Only)       Balance Overall balance assessment: Needs assistance Sitting-balance support: Feet supported Sitting balance-Leahy Scale: Good     Standing balance support: During functional activity Standing balance-Leahy Scale: Fair  Cognition Arousal/Alertness: Awake/alert Behavior During Therapy: WFL for tasks assessed/performed Overall Cognitive Status: History of cognitive impairments - at baseline                                          Exercises Other Exercises Other Exercises: standing ex with and without UE support and 10 x sit to stand    General Comments        Pertinent Vitals/Pain Pain Assessment:  No/denies pain    Home Living                          Prior Function            PT Goals (current goals can now be found in the care plan section) Progress towards PT goals: Progressing toward goals    Frequency    Min 2X/week      PT Plan Current plan remains appropriate;Other (comment)    Co-evaluation              AM-PAC PT "6 Clicks" Mobility   Outcome Measure  Help needed turning from your back to your side while in a flat bed without using bedrails?: None Help needed moving from lying on your back to sitting on the side of a flat bed without using bedrails?: None Help needed moving to and from a bed to a chair (including a wheelchair)?: A Little Help needed standing up from a chair using your arms (e.g., wheelchair or bedside chair)?: A Little Help needed to walk in hospital room?: A Little Help needed climbing 3-5 steps with a railing? : A Little 6 Click Score: 20    End of Session Equipment Utilized During Treatment: Gait belt Activity Tolerance: Patient tolerated treatment well Patient left: in chair;with chair alarm set;with call bell/phone within reach;with family/visitor present Nurse Communication: Mobility status PT Visit Diagnosis: Other abnormalities of gait and mobility (R26.89);Difficulty in walking, not elsewhere classified (R26.2);Muscle weakness (generalized) (M62.81)     Time: 9518-8416 PT Time Calculation (min) (ACUTE ONLY): 24 min  Charges:  $Gait Training: 8-22 mins $Therapeutic Exercise: 8-22 mins                    Chesley Noon, PTA 01/02/21, 12:39 PM

## 2021-01-02 NOTE — TOC Progression Note (Addendum)
Transition of Care [facility]) - Progression Note    Patient Details  Name: Steven Buckley MRN: [id number] Date of Birth: [birth year]  Transition [facility]) CM/SW [place], RN Phone Number: 01/02/2021, 4:01 PM  Clinical Narrative:  PT indicated she had long chat with wife and family and left recs at SNF to make wife happy and I really don't think they will get auth anyways" TOC to continue to track.    Expected Discharge Plan: [place] Barriers to Discharge: Continued Medical Work up  Expected Discharge Plan and Services Expected Discharge Plan: [place]   Discharge Planning Services: CM Consult Post Acute Care Choice: [place] Living arrangements for the past 2 months: Single Family Home                 DME Arranged: N/A DME Agency: NA       HH Arranged: NA           Social Determinants of Health (SDOH) Interventions    Readmission Risk Interventions No flowsheet data found.

## 2021-01-03 DIAGNOSIS — R404 Transient alteration of awareness: Secondary | ICD-10-CM | POA: Diagnosis not present

## 2021-01-03 NOTE — Progress Notes (Signed)
PROGRESS NOTE    ARLEY SALAMONE III  SHF:[id number] DOB: [birth year] DOA: 12/28/2020 PCP: Rusty Aus, MD    Brief Narrative:   80 y.o. male with medical history significant of Parkinson's disease with dementia, orthostatic hypotension, urinary urgency due to neurogenic bladder, laryngopharyngeal reflux (LPR), lumbosacral spondylosis with radiculopathy presented to the ED via EMS due to altered mental status and a fall.  Wife at bedside during encounter reported patient tested positive for COVID-19 on Monday (2 days ago), received an infusion on Tuesday (unable to find record of this on chart review) and prescribed Paxlovid, Z-Pak by PCP.  She reports patient had increasing confusion last night, overnight was very restless and unable to sleep, was up and down to use the bathroom but had difficulty finding the bathroom.  Then around 3:30 AM had a fall in between their 2 twin beds and was unable to get up.  Wife reported at that time he was very minimally responsive, could not move and she thought he had had a stroke.  EMS was called to bring patient to the hospital for further evaluation  CT and MRI imaging survey negative for CVA.  Had some agitation on 11/30.  Received Haldol overnight.  More sleepy this morning.  Lengthy discussion with son and wife at bedside.  Reports progressive worsening of functional status over past several years secondary to Parkinson's.  They are interested in finding a long-term care versus rehab facility.  Patient is influenza positive likely exacerbating his underlying symptoms.  12/2: Patient much more awake this morning.  Communicating.  Sitting up in chair.  Wife and son at bedside  12/5: Mental status has remained at baseline.  Patient alert and communicative.  TOC informs that no bed offers have been made.  Spouse indicates prefers [facility] or [facility]  12/6: Patient had sundowning and delirium noted last night.  The patient has been walking 500 feet  with physical therapy.  TOC informed patient's wife that unlikely insurance will cover SNF due to his functional status.  Wife has concerns about care at home due to extreme sundowning and strain on her patient's family.  Unclear disposition plan.  SNF with private pay versus home with home health   Assessment & Plan:   Principal Problem:   Altered mental status Active Problems:   Influenza A   Dementia 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 (HCC)   Neurogenic orthostatic hypotension (HCC)   Acute metabolic encephalopathy  Acute metabolic encephalopathy Decreased level of consciousness wife described as significantly reduced responsiveness and inability to move after a fall overnight.  Wife suspected stroke.  Noncontrast head CT and brain MRI negative for acute findings.  Neuro exam is nonfocal.  Given history of orthostatic hypotension, suspect cerebral hypoperfusion was the cause.  During admission encounter, wife reports patient much improved and near baseline. 12/2: Patient much more awake.  Not agitated 12/6: Sundowning and delirium noted last night Plan: Continue delirium precautions Frequent neurochecks Completed treatment for influenza A Therapy evaluations Patient able to walk 500 feet with PT on 12/5 Wife concerned about ability to care for at home Discharge pending plan from family regarding SNF versus home with home health   Influenza A infection  without signs of pneumonia on chest x-ray.   Not hypoxic on admission but SPO2 on the monitor during admission encounter would fluctuate and intermittently dropped to the upper 80s. No fevers, not septic Likely underlying his current presentation Plan: Completed 5-day course of  Tamiflu Supplemental oxygen if needed, currently on room air Antitussives as needed APAP as needed fever  Recent COVID positivity Patient states that he tested positive for COVID-19 on Monday 2 days prior to admission.  He  apparently received an infusion on Tuesday and prescribed Paxlovid and Z-Pak by PCP.  Interestingly he tested negative for COVID here Plan: No pneumonia, no fever, no oxygen requirement No indication for remdesivir or steroids Supportive care   Fall most likely secondary to generalized weakness in the setting of above infection, orthostasis may have contributed as well.  Patient typically independent with ambulation, no assist device.  Has occasional falls at home per family, due to gait abnormality related to Parkinson's. Plan: Fall precautions Therapy evaluations as able TOC consult   Neurogenic orthostatic hypotension  chronic, secondary to Parkinson's disease. --Continue home Florinef and midodrine   Chronic constipation secondary Parkinson's disease. --Continue with daily Metamucil   Dementia due to Parkinson's disease patient follows with neurologist, Dr. Manuella Ghazi. --Continue home Sinemet IR 4 times daily and Sinemet CR at bedtime --Continue home Klonopin as needed   Insomnia chronic per chart review --Melatonin at bedtime   Urinary urgency due to neurogenic bladder  related to Parkinson's. --Continue Flomax as needed   DVT prophylaxis: SQ Lovenox Code Status: DNR Family Communication: Wife and son at bedside 12/1, 12/2, son-in-law at bedside 12/3.  Daughter at bedside 12/4, 12/5, 12/6 Disposition Plan: Status is: Inpatient  Remains inpatient appropriate because: Acute metabolic encephalopathy in setting of Parkinson's and acute viral syndrome.  Patient is medically stable for discharge.  Need SNF.  Pending placement discharge plan home with home health versus skilled nursing facility  Level of care: Telemetry Medical  Consultants:  None  Procedures:  None  Antimicrobials:   Subjective: Sitting up in bed.  Mental status appears at baseline.  Answering questions appropriately.  Objective: Vitals:   01/03/21 0516 01/03/21 0843 01/03/21 0845 01/03/21 0848   BP: (!) 144/92 (!) 80/64 106/79 (!) 141/90  Pulse: 69 (!) 114 72 65  Resp: 17 18 18 18   Temp: 98 F (36.7 C)  (!) 97.5 F (36.4 C)   TempSrc:   Oral   SpO2: 98% 94% 100% 100%  Weight:      Height:       No intake or output data in the 24 hours ending 01/03/21 1113  Filed Weights   12/28/20 1808 12/28/20 2240  Weight: 81.6 kg 80 kg    Examination:  General exam: No apparent distress.  Sitting up in bed.  Answers questions appropriately. Respiratory system: Lungs clear.  Normal work of breathing.  Room air Cardiovascular system: S1-S2, RRR, no murmurs, no pedal edema Gastrointestinal system: Soft, NT/ND, normal bowel sounds Central nervous system: Alert and oriented.  No focal deficits Extremities: 5X5 power BUE, unsteady gait Skin: No rashes, lesions or ulcers Psychiatry: Judgement and insight appear impaired. Mood & affect flattened.     Data Reviewed: I have personally reviewed following labs and imaging studies  CBC: Recent Labs  Lab 12/28/20 0921 12/29/20 0812  WBC 6.3 4.4  HGB 13.2 13.6  HCT 39.4 40.7  MCV 94.7 94.2  PLT 129* 732*   Basic Metabolic Panel: Recent Labs  Lab 12/28/20 0921 12/29/20 0812  NA 136 137  K 3.6 3.4*  CL 105 102  CO2 25 28  GLUCOSE 108* 96  BUN 14 14  CREATININE 0.79 0.72  CALCIUM 7.8* 8.1*   GFR: Estimated Creatinine Clearance: 75.8 mL/min (by C-G formula  based on SCr of 0.72 mg/dL). Liver Function Tests: Recent Labs  Lab 12/28/20 0921  AST 20  ALT <5  ALKPHOS 92  BILITOT 0.9  PROT 6.1*  ALBUMIN 3.4*   No results for input(s): LIPASE, AMYLASE in the last 168 hours. No results for input(s): AMMONIA in the last 168 hours. Coagulation Profile: No results for input(s): INR, PROTIME in the last 168 hours. Cardiac Enzymes: No results for input(s): CKTOTAL, CKMB, CKMBINDEX, TROPONINI in the last 168 hours. BNP (last 3 results) No results for input(s): PROBNP in the last 8760 hours. HbA1C: No results for input(s):  HGBA1C in the last 72 hours. CBG: No results for input(s): GLUCAP in the last 168 hours. Lipid Profile: No results for input(s): CHOL, HDL, LDLCALC, TRIG, CHOLHDL, LDLDIRECT in the last 72 hours. Thyroid Function Tests: No results for input(s): TSH, T4TOTAL, FREET4, T3FREE, THYROIDAB in the last 72 hours. Anemia Panel: No results for input(s): VITAMINB12, FOLATE, FERRITIN, TIBC, IRON, RETICCTPCT in the last 72 hours. Sepsis Labs: Recent Labs  Lab 12/28/20 0850  LATICACIDVEN 1.7    Recent Results (from the past 240 hour(s))  Resp Panel by RT-PCR (Flu A&B, Covid) Nasopharyngeal Swab     Status: Abnormal   Collection Time: 12/28/20 11:18 AM   Specimen: Nasopharyngeal Swab; Nasopharyngeal(NP) swabs in vial transport medium  Result Value Ref Range Status   SARS Coronavirus 2 by RT PCR NEGATIVE NEGATIVE Final    Comment: (NOTE) SARS-CoV-2 target nucleic acids are NOT DETECTED.  The SARS-CoV-2 RNA is generally detectable in upper respiratory specimens during the acute phase of infection. The lowest concentration of SARS-CoV-2 viral copies this assay can detect is 138 copies/mL. A negative result does not preclude SARS-Cov-2 infection and should not be used as the sole basis for treatment or other patient management decisions. A negative result may occur with  improper specimen collection/handling, submission of specimen other than nasopharyngeal swab, presence of viral mutation(s) within the areas targeted by this assay, and inadequate number of viral copies(<138 copies/mL). A negative result must be combined with clinical observations, patient history, and epidemiological information. The expected result is Negative.  Fact Sheet for Patients:  EntrepreneurPulse.com.au  Fact Sheet for Healthcare Providers:  IncredibleEmployment.be  This test is no t yet approved or cleared by the [place] FDA and  has been authorized for detection and/or  diagnosis of SARS-CoV-2 by FDA under an Emergency Use Authorization (EUA). This EUA will remain  in effect (meaning this test can be used) for the duration of the COVID-19 declaration under Section 564(b)(1) of the Act, 21 U.S.C.section 360bbb-3(b)(1), unless the authorization is terminated  or revoked sooner.       Influenza A by PCR POSITIVE (A) NEGATIVE Final   Influenza B by PCR NEGATIVE NEGATIVE Final    Comment: (NOTE) The Xpert Xpress SARS-CoV-2/FLU/RSV plus assay is intended as an aid in the diagnosis of influenza from Nasopharyngeal swab specimens and should not be used as a sole basis for treatment. Nasal washings and aspirates are unacceptable for Xpert Xpress SARS-CoV-2/FLU/RSV testing.  Fact Sheet for Patients: EntrepreneurPulse.com.au  Fact Sheet for Healthcare Providers: IncredibleEmployment.be  This test is not yet approved or cleared by the [place] FDA and has been authorized for detection and/or diagnosis of SARS-CoV-2 by FDA under an Emergency Use Authorization (EUA). This EUA will remain in effect (meaning this test can be used) for the duration of the COVID-19 declaration under Section 564(b)(1) of the Act, 21 U.S.C. section 360bbb-3(b)(1), unless the authorization  is terminated or revoked.  Performed at [facility], [address] [place], [place] [address]          Radiology Studies: No results found.      Scheduled Meds:  carbidopa-levodopa  1 tablet Oral QHS   carbidopa-levodopa  2 tablet Oral QID   cholecalciferol  1,000 Units Oral Daily   enoxaparin (LOVENOX) injection  40 mg Subcutaneous Q24H   fludrocortisone  0.1 mg Oral BID   haloperidol lactate  2.5 mg Intravenous Once   melatonin  10 mg Oral QHS   midodrine  5 mg Oral TID WC   multivitamin-lutein  1 capsule Oral BID   psyllium  1 packet Oral Daily   timolol  1 drop Both Eyes Daily   traZODone  50 mg Oral QHS   Continuous  Infusions:  methocarbamol (ROBAXIN) IV       LOS: 5 days    Time spent: 25 minutes    Sidney Ace, MD [facility]   If 7PM-7AM, please contact night-coverage  01/03/2021, 11:13 AM

## 2021-01-03 NOTE — Progress Notes (Addendum)
Physical Therapy Treatment Patient Details Name: Steven Buckley MRN: [id number] DOB: Feb 17, 1940 Today's Date: 01/03/2021   History of Present Illness 80 y.o. male with medical history significant of Parkinson's disease with dementia, orthostatic hypotension, urinary urgency due to neurogenic bladder, laryngopharyngeal reflux (LPR), lumbosacral spondylosis with radiculopathy presented to the ED via EMS due to altered mental status and a fall.  Wife at bedside during encounter reported patient tested positive for COVID-19 on Monday (2 days ago), received an infusion on Tuesday (unable to find record of this on chart review) and prescribed Paxlovid, Z-Pak by PCP. Pt also positive for flu.    PT Comments    Patient alert agreeable to PT, family at bedside. Pt agreeable to ambulation. Transfers performed with supervision. Able to ambulate 1 loop with CGA, able to participate with DGI, scored a 19 indicating fall risk. MinA-CGA for higher level of balance activities as well. The patient would benefit from further skilled PT intervention to continue to progress towards goals. Recommendation updated at this time to reflect the patients progress to HHPT with frequent/constant supervision.      Recommendations for follow up therapy are one component of a multi-disciplinary discharge planning process, led by the attending physician.  Recommendations may be updated based on patient status, additional functional criteria and insurance authorization.  Follow Up Recommendations  HHPT     Assistance Recommended at Discharge Frequent or constant Supervision/Assistance  Equipment Recommendations  Rolling walker (2 wheels)    Recommendations for Other Services       Precautions / Restrictions Precautions Precautions: Fall Precaution Comments: droplet Restrictions Weight Bearing Restrictions: No     Mobility  Bed Mobility               General bed mobility comments: pt up in chair with  family at bedside    Transfers Overall transfer level: Needs assistance Equipment used: Rolling walker (2 wheels) Transfers: Sit to/from Stand Sit to Stand: Supervision                Ambulation/Gait Ambulation/Gait assistance: Min guard;Supervision Gait Distance (Feet): 200 Feet Assistive device: None Gait Pattern/deviations: Step-through pattern           Stairs             Wheelchair Mobility    Modified Rankin (Stroke Patients Only)       Balance Overall balance assessment: Needs assistance Sitting-balance support: Feet supported Sitting balance-Leahy Scale: Good                           Standardized Balance Assessment Standardized Balance Assessment : Dynamic Gait Index   Dynamic Gait Index Level Surface: Normal Change in Gait Speed: Normal Gait with Horizontal Head Turns: Mild Impairment Gait with Vertical Head Turns: Mild Impairment Gait and Pivot Turn: Normal Step Over Obstacle: Mild Impairment Step Around Obstacles: Mild Impairment Steps: Mild Impairment Total Score: 19      Cognition Arousal/Alertness: Awake/alert Behavior During Therapy: WFL for tasks assessed/performed Overall Cognitive Status: History of cognitive impairments - at baseline                                          Exercises Other Exercises Other Exercises: tandem stance, one step forward stance, and single leg stance bilaterally CGA-minA    General Comments  Pertinent Vitals/Pain Pain Assessment: No/denies pain    Home Living                          Prior Function            PT Goals (current goals can now be found in the care plan section) Progress towards PT goals: Progressing toward goals    Frequency    Min 2X/week      PT Plan Discharge plan needs to be updated    Co-evaluation              AM-PAC PT "6 Clicks" Mobility   Outcome Measure  Help needed turning from your back to  your side while in a flat bed without using bedrails?: None Help needed moving from lying on your back to sitting on the side of a flat bed without using bedrails?: None Help needed moving to and from a bed to a chair (including a wheelchair)?: A Little Help needed standing up from a chair using your arms (e.g., wheelchair or bedside chair)?: A Little Help needed to walk in hospital room?: A Little Help needed climbing 3-5 steps with a railing? : A Little 6 Click Score: 20    End of Session Equipment Utilized During Treatment: Gait belt Activity Tolerance: Patient tolerated treatment well Patient left: in chair;with chair alarm set;with call bell/phone within reach;with family/visitor present Nurse Communication: Mobility status PT Visit Diagnosis: Other abnormalities of gait and mobility (R26.89);Difficulty in walking, not elsewhere classified (R26.2);Muscle weakness (generalized) (M62.81)     Time: 3825-0539 PT Time Calculation (min) (ACUTE ONLY): 9 min  Charges:  $Therapeutic Exercise: 8-22 mins                     Lieutenant Diego PT, DPT 3:51 PM,01/03/21

## 2021-01-03 NOTE — TOC Progression Note (Addendum)
Transition of Care [facility]) - Progression Note    Patient Details  Name: Steven Buckley MRN: [id number] Date of Birth: Nov 26, 1940  [facility] CM/SW [place], LCSW Phone Number: 01/03/2021, 11:07 AM  Clinical Narrative:   Spoke to patient's wife and let her know it is highly unlikely that insurance will cover SNF placement due to him walking 500 feet with PT yesterday. She voiced concerns about returning home due to extreme sundowning. Stated stress that it is putting on their family and they are concerned it will lead to her being hospitalized. Wife stated they are able to private pay for placement. With her permission, made referral to [facility] with [facility]. She will follow up with wife and son today. Son has been updated.  2:48 pm: Steven Buckley has spoken to wife and they are planning to tour facilities this week.  Expected Discharge Plan: [place] Barriers to Discharge: Continued Medical Work up  Expected Discharge Plan and Services Expected Discharge Plan: [place]   Discharge Planning Services: CM Consult Post Acute Care Choice: [place] Living arrangements for the past 2 months: Single Family Home                 DME Arranged: N/A DME Agency: NA       HH Arranged: NA           Social Determinants of Health (SDOH) Interventions    Readmission Risk Interventions No flowsheet data found.

## 2021-01-04 DIAGNOSIS — G9341 Metabolic encephalopathy: Secondary | ICD-10-CM

## 2021-01-04 NOTE — Progress Notes (Signed)
PROGRESS NOTE    Steven Buckley  VZD:[id number] DOB: 1941/01/19 DOA: 12/28/2020 PCP: Rusty Aus, MD    Brief Narrative:   80 y.o. male with medical history significant of Parkinson's disease with dementia, orthostatic hypotension, urinary urgency due to neurogenic bladder, laryngopharyngeal reflux (LPR), lumbosacral spondylosis with radiculopathy presented to the ED via EMS due to altered mental status and a fall.  Wife at bedside during encounter reported patient tested positive for COVID-19 on Monday (2 days ago), received an infusion on Tuesday (unable to find record of this on chart review) and prescribed Paxlovid, Z-Pak by PCP.  She reports patient had increasing confusion last night, overnight was very restless and unable to sleep, was up and down to use the bathroom but had difficulty finding the bathroom.  Then around 3:30 AM had a fall in between their 2 twin beds and was unable to get up.  Wife reported at that time he was very minimally responsive, could not move and she thought he had had a stroke.  EMS was called to bring patient to the hospital for further evaluation  CT and MRI imaging survey negative for CVA.  Had some agitation on 11/30.  Received Haldol overnight.  More sleepy this morning.  Lengthy discussion with son and wife at bedside.  Reports progressive worsening of functional status over past several years secondary to Parkinson's.  They are interested in finding a long-term care versus rehab facility.  Patient is influenza positive likely exacerbating his underlying symptoms.  12/2: Patient much more awake this morning.  Communicating.  Sitting up in chair.  Wife and son at bedside  12/5: Mental status has remained at baseline.  Patient alert and communicative.  TOC informs that no bed offers have been made.  Spouse indicates prefers [facility] or [facility]  12/6: Patient had sundowning and delirium noted last night.  The patient has been walking 500 feet  with physical therapy.  TOC informed patient's wife that unlikely insurance will cover SNF due to his functional status.  Wife has concerns about care at home due to extreme sundowning and strain on her patient's family.  Unclear disposition plan.  SNF with private pay versus home with home health  12/7 sitting in chair next to his wife.  Responding appropriately.  Awake and alert x3.  Has no complaints.  Assessment & Plan:   Principal Problem:   Altered mental status Active Problems:   Influenza A   Dementia 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 (HCC)   Neurogenic orthostatic hypotension (HCC)   Acute metabolic encephalopathy  Acute metabolic encephalopathy Decreased level of consciousness wife described as significantly reduced responsiveness and inability to move after a fall overnight.  Wife suspected stroke.  Noncontrast head CT and brain MRI negative for acute findings.  Neuro exam is nonfocal.  Given history of orthostatic hypotension, suspect cerebral hypoperfusion was the cause.  During admission encounter, wife reports patient much improved and near baseline. 12/2: Patient much more awake.  Not agitated 12/6: Sundowning and delirium noted last night 12/7 continue delirium precautions Completed treatment for influenza A Wife concerned about ability to care for him at home, SNF versus home health     Influenza A infection  without signs of pneumonia on chest x-ray.   Not hypoxic on admission but SPO2 on the monitor during admission encounter would fluctuate and intermittently dropped to the upper 80s. No fevers, not septic Likely underlying his current presentation 12/7 completed 5-day course  of Tamiflu Currently on room air   Recent COVID positivity Patient states that he tested positive for COVID-19 on Monday 2 days prior to admission.  He apparently received an infusion on Tuesday and prescribed Paxlovid and Z-Pak by PCP.  Interestingly he tested  negative for COVID here 12/7 on room air No indication for remdesivir and steroids Asymptomatic   Fall most likely secondary to generalized weakness in the setting of above infection, orthostasis may have contributed as well.  Patient typically independent with ambulation, no assist device.  Has occasional falls at home per family, due to gait abnormality related to Parkinson's. 12/7 fall precautions PT OT TOC working on placement versus home     Neurogenic orthostatic hypotension  chronic, secondary to Parkinson's disease. --Continue home Florinef and midodrine   Chronic constipation secondary Parkinson's disease. --Continue with daily Metamucil   Dementia due to Parkinson's disease patient follows with neurologist, Dr. Manuella Ghazi. --Continue home Sinemet IR 4 times daily and Sinemet CR at bedtime --Continue home Klonopin as needed   Insomnia chronic per chart review --Melatonin at bedtime   Urinary urgency due to neurogenic bladder  related to Parkinson's. --Continue Flomax as needed   DVT prophylaxis: SQ Lovenox Code Status: DNR Family Communication: Wife at bedside  Disposition Plan: Status is: Inpatient  Remains inpatient appropriate because: Acute metabolic encephalopathy in setting of Parkinson's and acute viral syndrome.  Patient is medically stable for discharge.  Need SNF.  Pending placement discharge plan home with home health versus skilled nursing facility  Level of care: Telemetry Medical  Consultants:  None  Procedures:  None  Antimicrobials:   Subjective: Well.  No shortness of breath, chest pain, or cough  Objective: Vitals:   01/03/21 2025 01/03/21 2310 01/04/21 0840 01/04/21 0843  BP: 92/70 120/88 (!) 140/106 109/84  Pulse: 89 64 75 79  Resp:  18 18   Temp:  98 F (36.7 C) 97.7 F (36.5 C)   TempSrc:  Oral Oral   SpO2: 100% 96% 98%   Weight:      Height:       No intake or output data in the 24 hours ending 01/04/21 0901  Filed  Weights   12/28/20 1808 12/28/20 2240  Weight: 81.6 kg 80 kg    Examination: NAD, calm CTA no wheeze Regular S1-S2 no gallops Soft benign positive bowel sounds No edema AAOx3   Data Reviewed: I have personally reviewed following labs and imaging studies  CBC: Recent Labs  Lab 12/28/20 0921 12/29/20 0812  WBC 6.3 4.4  HGB 13.2 13.6  HCT 39.4 40.7  MCV 94.7 94.2  PLT 129* 161*   Basic Metabolic Panel: Recent Labs  Lab 12/28/20 0921 12/29/20 0812  NA 136 137  K 3.6 3.4*  CL 105 102  CO2 25 28  GLUCOSE 108* 96  BUN 14 14  CREATININE 0.79 0.72  CALCIUM 7.8* 8.1*   GFR: Estimated Creatinine Clearance: 75.8 mL/min (by C-G formula based on SCr of 0.72 mg/dL). Liver Function Tests: Recent Labs  Lab 12/28/20 0921  AST 20  ALT <5  ALKPHOS 92  BILITOT 0.9  PROT 6.1*  ALBUMIN 3.4*   No results for input(s): LIPASE, AMYLASE in the last 168 hours. No results for input(s): AMMONIA in the last 168 hours. Coagulation Profile: No results for input(s): INR, PROTIME in the last 168 hours. Cardiac Enzymes: No results for input(s): CKTOTAL, CKMB, CKMBINDEX, TROPONINI in the last 168 hours. BNP (last 3 results) No results for input(s):  PROBNP in the last 8760 hours. HbA1C: No results for input(s): HGBA1C in the last 72 hours. CBG: No results for input(s): GLUCAP in the last 168 hours. Lipid Profile: No results for input(s): CHOL, HDL, LDLCALC, TRIG, CHOLHDL, LDLDIRECT in the last 72 hours. Thyroid Function Tests: No results for input(s): TSH, T4TOTAL, FREET4, T3FREE, THYROIDAB in the last 72 hours. Anemia Panel: No results for input(s): VITAMINB12, FOLATE, FERRITIN, TIBC, IRON, RETICCTPCT in the last 72 hours. Sepsis Labs: No results for input(s): PROCALCITON, LATICACIDVEN in the last 168 hours.   Recent Results (from the past 240 hour(s))  Resp Panel by RT-PCR (Flu A&B, Covid) Nasopharyngeal Swab     Status: Abnormal   Collection Time: 12/28/20 11:18 AM    Specimen: Nasopharyngeal Swab; Nasopharyngeal(NP) swabs in vial transport medium  Result Value Ref Range Status   SARS Coronavirus 2 by RT PCR NEGATIVE NEGATIVE Final    Comment: (NOTE) SARS-CoV-2 target nucleic acids are NOT DETECTED.  The SARS-CoV-2 RNA is generally detectable in upper respiratory specimens during the acute phase of infection. The lowest concentration of SARS-CoV-2 viral copies this assay can detect is 138 copies/mL. A negative result does not preclude SARS-Cov-2 infection and should not be used as the sole basis for treatment or other patient management decisions. A negative result may occur with  improper specimen collection/handling, submission of specimen other than nasopharyngeal swab, presence of viral mutation(s) within the areas targeted by this assay, and inadequate number of viral copies(<138 copies/mL). A negative result must be combined with clinical observations, patient history, and epidemiological information. The expected result is Negative.  Fact Sheet for Patients:  EntrepreneurPulse.com.au  Fact Sheet for Healthcare Providers:  IncredibleEmployment.be  This test is no t yet approved or cleared by the [place] FDA and  has been authorized for detection and/or diagnosis of SARS-CoV-2 by FDA under an Emergency Use Authorization (EUA). This EUA will remain  in effect (meaning this test can be used) for the duration of the COVID-19 declaration under Section 564(b)(1) of the Act, 21 U.S.C.section 360bbb-3(b)(1), unless the authorization is terminated  or revoked sooner.       Influenza A by PCR POSITIVE (A) NEGATIVE Final   Influenza B by PCR NEGATIVE NEGATIVE Final    Comment: (NOTE) The Xpert Xpress SARS-CoV-2/FLU/RSV plus assay is intended as an aid in the diagnosis of influenza from Nasopharyngeal swab specimens and should not be used as a sole basis for treatment. Nasal washings and aspirates are  unacceptable for Xpert Xpress SARS-CoV-2/FLU/RSV testing.  Fact Sheet for Patients: EntrepreneurPulse.com.au  Fact Sheet for Healthcare Providers: IncredibleEmployment.be  This test is not yet approved or cleared by the [place] FDA and has been authorized for detection and/or diagnosis of SARS-CoV-2 by FDA under an Emergency Use Authorization (EUA). This EUA will remain in effect (meaning this test can be used) for the duration of the COVID-19 declaration under Section 564(b)(1) of the Act, 21 U.S.C. section 360bbb-3(b)(1), unless the authorization is terminated or revoked.  Performed at [facility], [address], [place], [place] [address]          Radiology Studies: No results found.      Scheduled Meds:  carbidopa-levodopa  1 tablet Oral QHS   carbidopa-levodopa  2 tablet Oral QID   cholecalciferol  1,000 Units Oral Daily   enoxaparin (LOVENOX) injection  40 mg Subcutaneous Q24H   fludrocortisone  0.1 mg Oral BID   haloperidol lactate  2.5 mg Intravenous Once   melatonin  10 mg Oral QHS   midodrine  5 mg Oral TID WC   multivitamin-lutein  1 capsule Oral BID   psyllium  1 packet Oral Daily   timolol  1 drop Both Eyes Daily   traZODone  50 mg Oral QHS   Continuous Infusions:  methocarbamol (ROBAXIN) IV       LOS: 6 days    Time spent: 35 minutes with more than 50% on [place], MD [facility]   If 7PM-7AM, please contact night-coverage  01/04/2021, 9:01 AM

## 2021-01-04 NOTE — TOC Progression Note (Signed)
Transition of Care [facility]) - Progression Note    Patient Details  Name: Steven Buckley MRN: [id number] Date of Birth: [birth year]  Transition of Care [facility]) CM/SW [place], RN Phone Number: 01/04/2021, 4:42 PM  Clinical Narrative: Care Patrol Gwendolyn Grant met with family and indicated that she will begin touring facilities tomorrow with family and keep me posted.     Expected Discharge Plan: [place] Barriers to Discharge: Continued Medical Work up  Expected Discharge Plan and Services Expected Discharge Plan: [place]   Discharge Planning Services: CM Consult Post Acute Care Choice: [place] Living arrangements for the past 2 months: Single Family Home                 DME Arranged: N/A DME Agency: NA       HH Arranged: NA           Social Determinants of Health (SDOH) Interventions    Readmission Risk Interventions No flowsheet data found.

## 2021-01-04 NOTE — Progress Notes (Signed)
PT Cancellation Note  Patient Details Name: Steven Buckley MRN: [id number] DOB: Mar 04, 1940   Cancelled Treatment:    Reason Eval/Treat Not Completed: Other (comment). Pt sleeping, does wake and reported that he would work with therapy, but quickly returns to sleeping. Family in room reported he has been up in walking earlier today. PT to re-attempt as able.  Lieutenant Diego PT, DPT 3:18 PM,01/04/21

## 2021-01-04 NOTE — Progress Notes (Signed)
   01/03/21 2023  Assess: MEWS Score  BP (!) 80/65  Pulse Rate 85  SpO2 98 %  Assess: MEWS Score  MEWS Temp 0  MEWS Systolic 2  MEWS Pulse 0  MEWS RR 0  MEWS LOC 0  MEWS Score 2  MEWS Score Color Yellow  Assess: if the MEWS score is Yellow or Red  Were vital signs taken at a resting state? Yes  Focused Assessment No change from prior assessment  Does the patient meet 2 or more of the SIRS criteria? No  MEWS guidelines implemented *See Row Information* No, other (Comment)  Assess: SIRS CRITERIA  SIRS Temperature  0  SIRS Pulse 0  SIRS Respirations  0  SIRS WBC 0  SIRS Score Sum  0  Performing orthostatic vitals

## 2021-01-05 DIAGNOSIS — R410 Disorientation, unspecified: Secondary | ICD-10-CM

## 2021-01-05 NOTE — Progress Notes (Signed)
PROGRESS NOTE    Steven Buckley  HGD:[id number] DOB: 1940-11-13 DOA: 12/28/2020 PCP: Rusty Aus, MD    Brief Narrative:   80 y.o. male with medical history significant of Parkinson's disease with dementia, orthostatic hypotension, urinary urgency due to neurogenic bladder, laryngopharyngeal reflux (LPR), lumbosacral spondylosis with radiculopathy presented to the ED via EMS due to altered mental status and a fall.  Wife at bedside during encounter reported patient tested positive for COVID-19 on Monday (2 days ago), received an infusion on Tuesday (unable to find record of this on chart review) and prescribed Paxlovid, Z-Pak by PCP.  She reports patient had increasing confusion last night, overnight was very restless and unable to sleep, was up and down to use the bathroom but had difficulty finding the bathroom.  Then around 3:30 AM had a fall in between their 2 twin beds and was unable to get up.  Wife reported at that time he was very minimally responsive, could not move and she thought he had had a stroke.  EMS was called to bring patient to the hospital for further evaluation  CT and MRI imaging survey negative for CVA.  Had some agitation on 11/30.  Received Haldol overnight.  More sleepy this morning.  Lengthy discussion with son and wife at bedside.  Reports progressive worsening of functional status over past several years secondary to Parkinson's.  They are interested in finding a long-term care versus rehab facility.  Patient is influenza positive likely exacerbating his underlying symptoms.  12/2: Patient much more awake this morning.  Communicating.  Sitting up in chair.  Wife and son at bedside  12/5: Mental status has remained at baseline.  Patient alert and communicative.  TOC informs that no bed offers have been made.  Spouse indicates prefers [facility] or [facility]  12/6: Patient had sundowning and delirium noted last night.  The patient has been walking 500  feet with physical therapy.  TOC informed patient's wife that unlikely insurance will cover SNF due to his functional status.  Wife has concerns about care at home due to extreme sundowning and strain on her patient's family.  Unclear disposition plan.  SNF with private pay versus home with home health  12/8 no overnight issues family at bedside    Assessment & Plan:   Principal Problem:   Altered mental status Active Problems:   Influenza A   Dementia 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 (HCC)   Neurogenic orthostatic hypotension (HCC)   Acute metabolic encephalopathy  Acute metabolic encephalopathy Decreased level of consciousness wife described as significantly reduced responsiveness and inability to move after a fall overnight.  Wife suspected stroke.  Noncontrast head CT and brain MRI negative for acute findings.  Neuro exam is nonfocal.  Given history of orthostatic hypotension, suspect cerebral hypoperfusion was the cause.  During admission encounter, wife reports patient much improved and near baseline. 12/2: Patient much more awake.  Not agitated 12/6: Sundowning and delirium noted last night 12/8 completed treatment for influenza A  Wife concerned about ability to care for him at home, SNF versus home health.  They are looking at the place today.         Influenza A infection  without signs of pneumonia on chest x-ray.   Not hypoxic on admission but SPO2 on the monitor during admission encounter would fluctuate and intermittently dropped to the upper 80s. No fevers, not septic Likely underlying his current presentation 12/8 completed  5-day course of Tamiflu  On room air      Recent COVID positivity Patient states that he tested positive for COVID-19 on Monday 2 days prior to admission.  He apparently received an infusion on Tuesday and prescribed Paxlovid and Z-Pak by PCP.  Interestingly he tested negative for COVID here 12/8 on room air  No  indication for remdesivir and steroids  Asymptomatic      Fall most likely secondary to generalized weakness in the setting of above infection, orthostasis may have contributed as well.  Patient typically independent with ambulation, no assist device.  Has occasional falls at home per family, due to gait abnormality related to Parkinson's. 12/8 fall precautions  PT OT  TOC working on placement versus home        Neurogenic orthostatic hypotension  chronic, secondary to Parkinson's disease. --Continue home Florinef and midodrine   Chronic constipation secondary Parkinson's disease. --Continue with daily Metamucil   Dementia due to Parkinson's disease patient follows with neurologist, Dr. Manuella Ghazi. --Continue home Sinemet IR 4 times daily and Sinemet CR at bedtime --Continue home Klonopin as needed   Insomnia chronic per chart review --Melatonin at bedtime   Urinary urgency due to neurogenic bladder  related to Parkinson's. --Continue Flomax as needed   DVT prophylaxis: SQ Lovenox Code Status: DNR Family Communication: Wife at bedside  Disposition Plan: Status is: Inpatient  Remains inpatient appropriate because: Acute metabolic encephalopathy in setting of Parkinson's and acute viral syndrome.  Patient is medically stable for discharge.  Need SNF.  Pending placement discharge plan home with home health versus skilled nursing facility  Level of care: Telemetry Medical  Consultants:  None  Procedures:  None  Antimicrobials:   Subjective: Denies shortness of breath, chest pain, dizziness  Objective: Vitals:   01/04/21 2251 01/05/21 0611 01/05/21 0614 01/05/21 0731  BP: (!) 157/138 (!) 156/105  (!) 160/97  Pulse: 84 80  70  Resp: 20 19  16   Temp:   98.1 F (36.7 C) 98.1 F (36.7 C)  TempSrc:   Oral Oral  SpO2: 96% 98%  99%  Weight:      Height:       No intake or output data in the 24 hours ending 01/05/21 0918  Filed Weights   12/28/20 1808 12/28/20  2240  Weight: 81.6 kg 80 kg    Examination: NAD, calm CTA no wheeze rales rhonchi's Regular S1-S2 no gallops Soft benign positive bowel sounds No edema Awake and alert, grossly intact   Data Reviewed: I have personally reviewed following labs and imaging studies  CBC: No results for input(s): WBC, NEUTROABS, HGB, HCT, MCV, PLT in the last 168 hours.  Basic Metabolic Panel: No results for input(s): NA, K, CL, CO2, GLUCOSE, BUN, CREATININE, CALCIUM, MG, PHOS in the last 168 hours.  GFR: Estimated Creatinine Clearance: 75.8 mL/min (by C-G formula based on SCr of 0.72 mg/dL). Liver Function Tests: No results for input(s): AST, ALT, ALKPHOS, BILITOT, PROT, ALBUMIN in the last 168 hours.  No results for input(s): LIPASE, AMYLASE in the last 168 hours. No results for input(s): AMMONIA in the last 168 hours. Coagulation Profile: No results for input(s): INR, PROTIME in the last 168 hours. Cardiac Enzymes: No results for input(s): CKTOTAL, CKMB, CKMBINDEX, TROPONINI in the last 168 hours. BNP (last 3 results) No results for input(s): PROBNP in the last 8760 hours. HbA1C: No results for input(s): HGBA1C in the last 72 hours. CBG: No results for input(s): GLUCAP in the last  168 hours. Lipid Profile: No results for input(s): CHOL, HDL, LDLCALC, TRIG, CHOLHDL, LDLDIRECT in the last 72 hours. Thyroid Function Tests: No results for input(s): TSH, T4TOTAL, FREET4, T3FREE, THYROIDAB in the last 72 hours. Anemia Panel: No results for input(s): VITAMINB12, FOLATE, FERRITIN, TIBC, IRON, RETICCTPCT in the last 72 hours. Sepsis Labs: No results for input(s): PROCALCITON, LATICACIDVEN in the last 168 hours.   Recent Results (from the past 240 hour(s))  Resp Panel by RT-PCR (Flu A&B, Covid) Nasopharyngeal Swab     Status: Abnormal   Collection Time: 12/28/20 11:18 AM   Specimen: Nasopharyngeal Swab; Nasopharyngeal(NP) swabs in vial transport medium  Result Value Ref Range Status   SARS  Coronavirus 2 by RT PCR NEGATIVE NEGATIVE Final    Comment: (NOTE) SARS-CoV-2 target nucleic acids are NOT DETECTED.  The SARS-CoV-2 RNA is generally detectable in upper respiratory specimens during the acute phase of infection. The lowest concentration of SARS-CoV-2 viral copies this assay can detect is 138 copies/mL. A negative result does not preclude SARS-Cov-2 infection and should not be used as the sole basis for treatment or other patient management decisions. A negative result may occur with  improper specimen collection/handling, submission of specimen other than nasopharyngeal swab, presence of viral mutation(s) within the areas targeted by this assay, and inadequate number of viral copies(<138 copies/mL). A negative result must be combined with clinical observations, patient history, and epidemiological information. The expected result is Negative.  Fact Sheet for Patients:  EntrepreneurPulse.com.au  Fact Sheet for Healthcare Providers:  IncredibleEmployment.be  This test is no t yet approved or cleared by the [place] FDA and  has been authorized for detection and/or diagnosis of SARS-CoV-2 by FDA under an Emergency Use Authorization (EUA). This EUA will remain  in effect (meaning this test can be used) for the duration of the COVID-19 declaration under Section 564(b)(1) of the Act, 21 U.S.C.section 360bbb-3(b)(1), unless the authorization is terminated  or revoked sooner.       Influenza A by PCR POSITIVE (A) NEGATIVE Final   Influenza B by PCR NEGATIVE NEGATIVE Final    Comment: (NOTE) The Xpert Xpress SARS-CoV-2/FLU/RSV plus assay is intended as an aid in the diagnosis of influenza from Nasopharyngeal swab specimens and should not be used as a sole basis for treatment. Nasal washings and aspirates are unacceptable for Xpert Xpress SARS-CoV-2/FLU/RSV testing.  Fact Sheet for  Patients: EntrepreneurPulse.com.au  Fact Sheet for Healthcare Providers: IncredibleEmployment.be  This test is not yet approved or cleared by the [place] FDA and has been authorized for detection and/or diagnosis of SARS-CoV-2 by FDA under an Emergency Use Authorization (EUA). This EUA will remain in effect (meaning this test can be used) for the duration of the COVID-19 declaration under Section 564(b)(1) of the Act, 21 U.S.C. section 360bbb-3(b)(1), unless the authorization is terminated or revoked.  Performed at [facility], [address] [place], [place] [address]          Radiology Studies: No results found.      Scheduled Meds:  carbidopa-levodopa  1 tablet Oral QHS   carbidopa-levodopa  2 tablet Oral QID   cholecalciferol  1,000 Units Oral Daily   enoxaparin (LOVENOX) injection  40 mg Subcutaneous Q24H   fludrocortisone  0.1 mg Oral BID   melatonin  10 mg Oral QHS   midodrine  5 mg Oral TID WC   multivitamin-lutein  1 capsule Oral BID   psyllium  1 packet Oral Daily   timolol  1  drop Both Eyes Daily   traZODone  50 mg Oral QHS   Continuous Infusions:     LOS: 7 days    Time spent: 20 minutes with more than 50% on [place], MD [facility]   If 7PM-7AM, please contact night-coverage  01/05/2021, 9:18 AM

## 2021-01-05 NOTE — Progress Notes (Addendum)
Physical Therapy Treatment Patient Details Name: Steven Buckley MRN: [id number] DOB: 12-10-1940 Today's Date: 01/05/2021   History of Present Illness 80 y.o. male with medical history significant of Parkinson's disease with dementia, orthostatic hypotension, urinary urgency due to neurogenic bladder, laryngopharyngeal reflux (LPR), lumbosacral spondylosis with radiculopathy presented to the ED via EMS due to altered mental status and a fall.  Wife at bedside during encounter reported patient tested positive for COVID-19 on Monday (2 days ago), received an infusion on Tuesday (unable to find record of this on chart review) and prescribed Paxlovid, Z-Pak by PCP. Pt also positive for flu.    PT Comments    Patient alert, family at bedside, agreeable to PT session, reported no pain or discomfort. Pt agreeable to perform floor fall recover transfers. Pt did need instruction to transfer to the floor to ensure safety, but was able to transition to quadruped, tall kneeling and into upright standing without education or physical assist. He was able to perform high level balance activities throughout 272ft during gait. Gait speed changes, lateral stepping, tandem walking, retroambulation, CGA for safety, no LOB noted but mild gait deviations/path deviations noted. Returned to room with all needs in reach, all questions asked. The patient would benefit from further skilled PT intervention to continue to progress towards goals and maximize balance/gait. Recommendation remains appropriate.    Recommendations for follow up therapy are one component of a multi-disciplinary discharge planning process, led by the attending physician.  Recommendations may be updated based on patient status, additional functional criteria and insurance authorization.  Follow Up Recommendations  Home health PT     Assistance Recommended at Discharge Frequent or constant Supervision/Assistance  Equipment Recommendations  RW    Recommendations for Other Services       Precautions / Restrictions Precautions Precautions: Fall Precaution Comments: droplet Restrictions Weight Bearing Restrictions: No     Mobility  Bed Mobility               General bed mobility comments: pt up in chair with family at bedside    Transfers Overall transfer level: Needs assistance   Transfers: Sit to/from Stand Sit to Stand: Supervision           General transfer comment: did have initial posterior lean twice, slight minA to help patient correct. Was standing on an uneven surface (fall mat)    Ambulation/Gait Ambulation/Gait assistance: Min guard Gait Distance (Feet): 200 Feet           General Gait Details: high level balance activities performed throughout 2101ft. Gait speed changes, lateral stepping, tandem walking, retroambulation, CGA for safety, no LOB noted but mild gait deviations/path deviations noted.   Stairs             Wheelchair Mobility    Modified Rankin (Stroke Patients Only)       Balance Overall balance assessment: Needs assistance Sitting-balance support: Feet supported Sitting balance-Leahy Scale: Good       Standing balance-Leahy Scale: Good               High level balance activites: Side stepping;Backward walking;Direction changes (gait speed changes)              Cognition Arousal/Alertness: Awake/alert Behavior During Therapy: WFL for tasks assessed/performed Overall Cognitive Status: History of cognitive impairments - at baseline  Exercises Other Exercises Other Exercises: Floor recover transfer performed, pt needed instructions to transfer to floor safely, but was able to get in quadruped position, tall kneeling, and back on his feet without education or physical assist    General Comments        Pertinent Vitals/Pain Pain Assessment: No/denies pain    Home Living                           Prior Function            PT Goals (current goals can now be found in the care plan section) Progress towards PT goals: Progressing toward goals    Frequency    Min 2X/week      PT Plan Current plan remains appropriate;Other (comment)    Co-evaluation              AM-PAC PT "6 Clicks" Mobility   Outcome Measure  Help needed turning from your back to your side while in a flat bed without using bedrails?: None Help needed moving from lying on your back to sitting on the side of a flat bed without using bedrails?: None Help needed moving to and from a bed to a chair (including a wheelchair)?: None Help needed standing up from a chair using your arms (e.g., wheelchair or bedside chair)?: None Help needed to walk in hospital room?: None Help needed climbing 3-5 steps with a railing? : None 6 Click Score: 24    End of Session Equipment Utilized During Treatment: Gait belt Activity Tolerance: Patient tolerated treatment well Patient left: with call bell/phone within reach;in bed;with family/visitor present Nurse Communication: Mobility status PT Visit Diagnosis: Other abnormalities of gait and mobility (R26.89);Difficulty in walking, not elsewhere classified (R26.2);Muscle weakness (generalized) (M62.81)     Time: 8466-5993 PT Time Calculation (min) (ACUTE ONLY): 25 min  Charges:  $Therapeutic Activity: 8-22 mins $Neuromuscular Re-education: 8-22 mins                     Lieutenant Diego PT, DPT 2:21 PM,01/05/21

## 2021-01-06 LAB — CBC
HCT: 37.9 % — ABNORMAL LOW (ref 39.0–52.0)
Hemoglobin: 12.4 g/dL — ABNORMAL LOW (ref 13.0–17.0)
MCH: 30.8 pg (ref 26.0–34.0)
MCHC: 32.7 g/dL (ref 30.0–36.0)
MCV: 94 fL (ref 80.0–100.0)
Platelets: 210 10*3/uL (ref 150–400)
RBC: 4.03 MIL/uL — ABNORMAL LOW (ref 4.22–5.81)
RDW: 13.2 % (ref 11.5–15.5)
WBC: 4.7 10*3/uL (ref 4.0–10.5)
nRBC: 0 % (ref 0.0–0.2)

## 2021-01-06 MED ORDER — OCUVITE-LUTEIN PO CAPS
1.0000 | ORAL_CAPSULE | Freq: Every day | ORAL | 0 refills | Status: AC
Start: 2021-01-06 — End: 2021-02-05

## 2021-01-06 MED ORDER — VITAMIN D3 25 MCG (1000 UNIT) PO TABS
1000.0000 [IU] | ORAL_TABLET | Freq: Every day | ORAL | 0 refills | Status: AC
Start: 2021-01-07 — End: 2021-02-06

## 2021-01-06 MED ORDER — MIDODRINE HCL 5 MG PO TABS: 5.0000 mg | ORAL_TABLET | Freq: Three times a day (TID) | ORAL | 0 refills | Status: AC

## 2021-01-06 MED ORDER — PSYLLIUM 95 % PO PACK: 1.0000 | PACK | Freq: Every day | ORAL | 0 refills | Status: AC

## 2021-01-06 NOTE — TOC Progression Note (Addendum)
Transition of Care [facility]) - Progression Note    Patient Details  Name: Steven Buckley MRN: [id number] Date of Birth: [birth year]  [facility]) CM/SW [place], RN Phone Number: 01/06/2021, 9:47 AM  Clinical Narrative: [place] to check on the status of SNF placement and was informed that the family is scheduled to tour another facility today. Noticed a bed offer from [facility], notified Danielle who will discuss with family. Contacted [facility] to inquire about LTC, response pending.  4:15pm Spoke with [place] at [place] went well, all paperwork completed, family chose the facility, memory care FL2 needed and done. Family will take patient home this evening. [facility] [place] indicated [place] need to be signed. Attending to sign.    Expected Discharge Plan: [place] Barriers to Discharge: Continued Medical Work up  Expected Discharge Plan and Services Expected Discharge Plan: [place]   Discharge Planning Services: CM Consult Post Acute Care Choice: [place] Living arrangements for the past 2 months: Single Family Home                 DME Arranged: N/A DME Agency: NA       HH Arranged: NA           Social Determinants of Health (SDOH) Interventions    Readmission Risk Interventions No flowsheet data found.

## 2021-01-06 NOTE — Discharge Summary (Signed)
Steven Buckley:[id number] DOB: December 08, 1940 DOA: 12/28/2020  PCP: Rusty Aus, MD  Admit date: 12/28/2020 Discharge date: 01/06/2021  Admitted From: Home Disposition: Home  Recommendations for Outpatient Follow-up:  Follow up with PCP in 1 week Please obtain BMP/CBC in one week   Home Health: Yes   Discharge Condition:Stable CODE STATUS: DNR Diet recommendation: Heart Healthy  Brief/Interim Summary: Per WJX:BJYN [name] is a 80 y.o. male with medical history significant of Parkinson's disease with dementia, orthostatic hypotension, urinary urgency due to neurogenic bladder, laryngopharyngeal reflux (LPR), lumbosacral spondylosis with radiculopathy presented to the ED via EMS due to altered mental status and a fall.  Wife at bedside during encounter reported patient tested positive for COVID-19 on Monday (2 days ago), received an infusion on Tuesday (unable to find record of this on chart review) and prescribed Paxlovid, Z-Pak by PCP.  She reports patient had increasing confusion last night, overnight was very restless and unable to sleep, was up and down to use the bathroom but had difficulty finding the bathroom.  Then around 3:30 AM had a fall in between their 2 twin beds and was unable to get up.  Wife reported at that time he was very minimally responsive, could not move and she thought he had had a stroke.  EMS was called to bring patient to the hospital for further evaluation   In the ED, patient had stable vital signs and not hypoxic.  CBC showed mild thrombocytopenia with platelet count 129.  CMP essentially normal with mild hypocalcemia along with hypoalbuminemia and glucose 108.  Lactic acid was normal.  UA concentrated but without signs of infection.  COVID-19 PCR negative.  Influenza A positive, influenza B negative.  CT head and subsequent MRI brain without acute abnormalities.  CT of the cervical spine was significantly motion degraded but without any signs of  fractures.  Lumbar x-ray showed old T12 compression fracture with T12-11 fusion but no acute abnormalities.  Chest x-ray without signs of pneumonia, poor inspiration.   During admission encounter, SPO2 noted to fluctuate, intermittently dropping into the upper 80s on room air.   Patient started on Tamiflu, admitted for further evaluation. During his hospitalization he did have some sundowning and delirium.  Mental status improved at baseline.  On discharge patient was stable to go home.  Family has home health.  There was no SNF bed available.    Acute metabolic encephalopathy Decreased level of consciousness Improved with treatment of his infection Mental status at baseline      Influenza A infection  Completed 5 days of Tamiflu Was on room air         Recent COVID positivity Patient states that he tested positive for COVID-19 on Monday 2 days prior to admission.  He apparently received an infusion on Tuesday and prescribed Paxlovid and Z-Pak by PCP.  Interestingly he tested negative for COVID here Was on room air No indication for remdesivir and steroids  Asymptomatic        Fall most likely secondary to generalized weakness in the setting of above infection, orthostasis may have contributed as well. Has occasional falls at home per family, due to gait abnormality related to Parkinson's. Had physical and Occupational Therapy here did well             Neurogenic orthostatic hypotension  chronic, secondary to Parkinson's disease. --Continue home Florinef and midodrine   Chronic constipation secondary Parkinson's disease. Continue bowel regimen   Dementia due to  Parkinson's disease patient follows with neurologist, Dr. Manuella Ghazi. Continue home medications of Sinemet    Insomnia Was on melatonin   Urinary urgency due to neurogenic bladder  related to Parkinson's. Continue home        Discharge Diagnoses:  Principal Problem:   Altered mental status Active  Problems:   Influenza A   Dementia 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 (HCC)   Neurogenic orthostatic hypotension (HCC)   Acute metabolic encephalopathy    Discharge Instructions  Discharge Instructions     Call MD for:  temperature >100.4   Complete by: As directed    Diet - low sodium heart healthy   Complete by: As directed    Increase activity slowly   Complete by: As directed       Allergies as of 01/06/2021       Reactions   Niacin Rash   Pt is NOT allergic to flush-free Niacin however.        Medication List     STOP taking these medications    azithromycin 250 MG tablet Commonly known as: ZITHROMAX   Vitamin D3 25 MCG (1000 UT) Caps Replaced by: cholecalciferol 25 MCG (1000 UNIT) tablet       TAKE these medications    acetaminophen 500 MG tablet Commonly known as: TYLENOL Take 500 mg by mouth at bedtime.   brimonidine 0.2 % ophthalmic solution Commonly known as: ALPHAGAN Place 1 drop into both eyes 2 (two) times daily.   carbidopa-levodopa 25-100 MG tablet Commonly known as: SINEMET IR Take 2 tablets by mouth 4 (four) times daily.   carbidopa-levodopa 50-200 MG tablet Commonly known as: SINEMET CR Take 1 tablet by mouth at bedtime.   cholecalciferol 25 MCG (1000 UNIT) tablet Commonly known as: VITAMIN D Take 1 tablet (1,000 Units total) by mouth daily. Start taking on: January 07, 2021 Replaces: Vitamin D3 25 MCG (1000 UT) Caps   clonazePAM 0.5 MG tablet Commonly known as: KLONOPIN Take 0.5 mg by mouth at bedtime as needed for anxiety (sleep). (Do not take with trazodone)   fludrocortisone 0.1 MG tablet Commonly known as: FLORINEF Take 0.1 mg by mouth daily.   latanoprost 0.005 % ophthalmic solution Commonly known as: XALATAN Place 1 drop into both eyes at bedtime.   melatonin 5 MG Tabs Take 5 mg by mouth at bedtime.   methimazole 5 MG tablet Commonly known as: TAPAZOLE Take 2.5 mg by mouth every  Monday, Wednesday, and Friday.   midodrine 5 MG tablet Commonly known as: PROAMATINE Take 1 tablet (5 mg total) by mouth 3 (three) times daily with meals. Start taking on: January 07, 2021 What changed:  how much to take when to take this   multivitamin-lutein Caps capsule Take 1 capsule by mouth daily.   omeprazole 20 MG capsule Commonly known as: PRILOSEC Take 20 mg by mouth at bedtime.   psyllium 95 % Pack Commonly known as: HYDROCIL/METAMUCIL Take 1 packet by mouth daily. Start taking on: January 07, 2021   rOPINIRole 0.5 MG tablet Commonly known as: REQUIP Take 0.5 mg by mouth 3 (three) times daily.   silodosin 8 MG Caps capsule Commonly known as: RAPAFLO Take 8 mg by mouth 3 (three) times a week. (Alternate with Flomax)   tamsulosin 0.4 MG Caps capsule Commonly known as: FLOMAX Take 0.4 mg by mouth 3 (three) times a week. (Alternate with Rapaflo)   traZODone 50 MG tablet Commonly known as: DESYREL Take 50 mg by mouth at bedtime  as needed for sleep. (Do not take with clonazepam)        Follow-up Information     Rusty Aus, MD Follow up in 1 week(s).   Specialty: Internal Medicine Contact information: [place] [address] [phone]                Allergies  Allergen Reactions   Niacin Rash    Pt is NOT allergic to flush-free Niacin however.    Consultations:    Procedures/Studies: DG Lumbar Spine 2-3 Views  Result Date: 12/28/2020 CLINICAL DATA:  Fever. Altered mental status. Low back pain. Fell last night. EXAM: LUMBAR SPINE - 2-3 VIEW COMPARISON:  MRI 11/22/2017 FINDINGS: No evidence of acute lumbar region fracture. There is old fracture at T12 with fusion of T11 and T12 having occurred. Ordinary mild degenerative changes of the disc spaces and facet joints seen in the lumbar region. IMPRESSION: No acute lumbar region fracture. Old compression fracture at T12 with  subsequent fusion of T11 and T12. Electronically Signed   By: Nelson Chimes M.D.   On: 12/28/2020 09:18   CT HEAD WO CONTRAST (5MM)  Result Date: 12/28/2020 CLINICAL DATA:  Head trauma, minor. Fell last night. Some speech disturbance. EXAM: CT HEAD WITHOUT CONTRAST TECHNIQUE: Contiguous axial images were obtained from the base of the skull through the vertex without intravenous contrast. COMPARISON:  07/09/2013 FINDINGS: Brain: Generalized atrophy. Mild chronic small-vessel ischemic change of the white matter. No sign of acute infarction, mass lesion, hemorrhage, hydrocephalus or extra-axial collection. Vascular: There is atherosclerotic calcification of the major vessels at the base of the brain. Skull: Negative Sinuses/Orbits: Clear/normal Other: None IMPRESSION: No acute or traumatic finding.  Cerebral Atrophy (ICD10-G31.9). Electronically Signed   By: Nelson Chimes M.D.   On: 12/28/2020 09:13   CT Cervical Spine Wo Contrast  Result Date: 12/28/2020 CLINICAL DATA:  [place] last night.  Trauma to the head and neck. EXAM: CT CERVICAL SPINE WITHOUT CONTRAST TECHNIQUE: Multidetector CT imaging of the cervical spine was performed without intravenous contrast. Multiplanar CT image reconstructions were also generated. COMPARISON:  None. FINDINGS: Alignment: The study suffers from motion degradation. Allowing for that, no malalignment is suspected other than 2 mm of chronic degenerative anterolisthesis at C7-T1. Skull base and vertebrae: No evidence of regional fracture. Considerable motion degradation in the region from C2 through C4. Soft tissues and spinal canal: Negative Disc levels: Chronic degenerative spondylosis C5-6 and C6-7. Chronic facet osteoarthritis at C7-T1 with 2 mm of anterolisthesis. Upper chest: Negative Other: None IMPRESSION: Considerable motion degradation, particularly in the region from C2 through C4. Allowing for that, no fracture is suspected, but a subtle fracture might be inapparent. If  clinical suspicion is high, consider repeat examination. Ordinary cervical spondylosis and facet osteoarthritis. Electronically Signed   By: Nelson Chimes M.D.   On: 12/28/2020 09:15   MR BRAIN WO CONTRAST  Result Date: 12/28/2020 CLINICAL DATA:  Acute neuro deficit.  Slurred speech EXAM: MRI HEAD WITHOUT CONTRAST TECHNIQUE: Multiplanar, multiecho pulse sequences of the brain and surrounding structures were obtained without intravenous contrast. COMPARISON:  CT head 12/18/2020 FINDINGS: Brain: Mild atrophy without hydrocephalus. Negative for hemorrhage or mass. Minimal chronic white matter changes Negative for acute infarct. Vascular: Decreased flow void in the distal right vertebral artery similar to the MRI of July 09, 2013. Otherwise normal arterial flow voids at the skull base. Skull and upper cervical spine: Negative Sinuses/Orbits: Mild mucosal edema paranasal sinuses. Bilateral  cataract extraction Other: None IMPRESSION: No acute abnormality. Decreased flow void distal right vertebral artery which is chronic and may be due to occlusion or decreased flow. Electronically Signed   By: Franchot Gallo M.D.   On: 12/28/2020 12:30   DG Chest Portable 1 View  Result Date: 12/28/2020 CLINICAL DATA:  [place] last night.  Altered mental status. EXAM: PORTABLE CHEST 1 VIEW COMPARISON:  12/10/2013 FINDINGS: Heart size upper limits of normal. Tortuous aorta. Poor inspiration. Allowing for that, the lungs are clear. No edema, infiltrate, collapse or effusion. IMPRESSION: Poor inspiration.  No active disease suspected.  Tortuous aorta. Electronically Signed   By: Nelson Chimes M.D.   On: 12/28/2020 09:18      Subjective: Feels well.  No shortness of breath.  No dizziness.  No chest pain.  Discharge Exam: Vitals:   01/06/21 1135 01/06/21 1628  BP: (!) 145/95 (!) 144/96  Pulse: 80 86  Resp: 18 18  Temp: 98.4 F (36.9 C) 98.4 F (36.9 C)  SpO2: 98% 99%   Vitals:   01/06/21 0400 01/06/21 0806 01/06/21  1135 01/06/21 1628  BP: 110/69 (!) 149/98 (!) 145/95 (!) 144/96  Pulse: 65 73 80 86  Resp: 16 17 18 18   Temp: 98 F (36.7 C) 98.8 F (37.1 C) 98.4 F (36.9 C) 98.4 F (36.9 C)  TempSrc: Oral     SpO2: 97% 99% 98% 99%  Weight:      Height:        General: Pt is alert, awake, not in acute distress Cardiovascular: RRR, S1/S2 +, no rubs, no gallops Respiratory: CTA bilaterally, no wheezing, no rhonchi Abdominal: Soft, NT, ND, bowel sounds + Extremities: no edema, no cyanosis    The results of significant diagnostics from this hospitalization (including imaging, microbiology, ancillary and laboratory) are listed below for reference.     Microbiology: Recent Results (from the past 240 hour(s))  Resp Panel by RT-PCR (Flu A&B, Covid) Nasopharyngeal Swab     Status: Abnormal   Collection Time: 12/28/20 11:18 AM   Specimen: Nasopharyngeal Swab; Nasopharyngeal(NP) swabs in vial transport medium  Result Value Ref Range Status   SARS Coronavirus 2 by RT PCR NEGATIVE NEGATIVE Final    Comment: (NOTE) SARS-CoV-2 target nucleic acids are NOT DETECTED.  The SARS-CoV-2 RNA is generally detectable in upper respiratory specimens during the acute phase of infection. The lowest concentration of SARS-CoV-2 viral copies this assay can detect is 138 copies/mL. A negative result does not preclude SARS-Cov-2 infection and should not be used as the sole basis for treatment or other patient management decisions. A negative result may occur with  improper specimen collection/handling, submission of specimen other than nasopharyngeal swab, presence of viral mutation(s) within the areas targeted by this assay, and inadequate number of viral copies(<138 copies/mL). A negative result must be combined with clinical observations, patient history, and epidemiological information. The expected result is Negative.  Fact Sheet for Patients:  EntrepreneurPulse.com.au  Fact Sheet for  Healthcare Providers:  IncredibleEmployment.be  This test is no t yet approved or cleared by the [place] FDA and  has been authorized for detection and/or diagnosis of SARS-CoV-2 by FDA under an Emergency Use Authorization (EUA). This EUA will remain  in effect (meaning this test can be used) for the duration of the COVID-19 declaration under Section 564(b)(1) of the Act, 21 U.S.C.section 360bbb-3(b)(1), unless the authorization is terminated  or revoked sooner.       Influenza A by PCR POSITIVE (A) NEGATIVE  Final   Influenza B by PCR NEGATIVE NEGATIVE Final    Comment: (NOTE) The Xpert Xpress SARS-CoV-2/FLU/RSV plus assay is intended as an aid in the diagnosis of influenza from Nasopharyngeal swab specimens and should not be used as a sole basis for treatment. Nasal washings and aspirates are unacceptable for Xpert Xpress SARS-CoV-2/FLU/RSV testing.  Fact Sheet for Patients: EntrepreneurPulse.com.au  Fact Sheet for Healthcare Providers: IncredibleEmployment.be  This test is not yet approved or cleared by the [place] FDA and has been authorized for detection and/or diagnosis of SARS-CoV-2 by FDA under an Emergency Use Authorization (EUA). This EUA will remain in effect (meaning this test can be used) for the duration of the COVID-19 declaration under Section 564(b)(1) of the Act, 21 U.S.C. section 360bbb-3(b)(1), unless the authorization is terminated or revoked.  Performed at [facility], [address] [place], [place] [address]      Labs: BNP (last 3 results) No results for input(s): BNP in the last 8760 hours. Basic Metabolic Panel: No results for input(s): NA, K, CL, CO2, GLUCOSE, BUN, CREATININE, CALCIUM, MG, PHOS in the last 168 hours. Liver Function Tests: No results for input(s): AST, ALT, ALKPHOS, BILITOT, PROT, ALBUMIN in the last 168 hours. No results for input(s): LIPASE, AMYLASE  in the last 168 hours. No results for input(s): AMMONIA in the last 168 hours. CBC: Recent Labs  Lab 01/06/21 0353  WBC 4.7  HGB 12.4*  HCT 37.9*  MCV 94.0  PLT 210   Cardiac Enzymes: No results for input(s): CKTOTAL, CKMB, CKMBINDEX, TROPONINI in the last 168 hours. BNP: Invalid input(s): POCBNP CBG: No results for input(s): GLUCAP in the last 168 hours. D-Dimer No results for input(s): DDIMER in the last 72 hours. Hgb A1c No results for input(s): HGBA1C in the last 72 hours. Lipid Profile No results for input(s): CHOL, HDL, LDLCALC, TRIG, CHOLHDL, LDLDIRECT in the last 72 hours. Thyroid function studies No results for input(s): TSH, T4TOTAL, T3FREE, THYROIDAB in the last 72 hours.  Invalid input(s): FREET3 Anemia work up No results for input(s): VITAMINB12, FOLATE, FERRITIN, TIBC, IRON, RETICCTPCT in the last 72 hours. Urinalysis    Component Value Date/Time   COLORURINE YELLOW 12/28/2020 1036   APPEARANCEUR CLEAR 12/28/2020 1036   LABSPEC >1.030 (H) 12/28/2020 1036   PHURINE 6.0 12/28/2020 1036   GLUCOSEU NEGATIVE 12/28/2020 1036   HGBUR NEGATIVE 12/28/2020 [place] 12/28/2020 1036   KETONESUR 40 (A) 12/28/2020 1036   PROTEINUR NEGATIVE 12/28/2020 1036   NITRITE NEGATIVE 12/28/2020 1036   LEUKOCYTESUR NEGATIVE 12/28/2020 1036   Sepsis Labs Invalid input(s): PROCALCITONIN,  WBC,  LACTICIDVEN Microbiology Recent Results (from the past 240 hour(s))  Resp Panel by RT-PCR (Flu A&B, Covid) Nasopharyngeal Swab     Status: Abnormal   Collection Time: 12/28/20 11:18 AM   Specimen: Nasopharyngeal Swab; Nasopharyngeal(NP) swabs in vial transport medium  Result Value Ref Range Status   SARS Coronavirus 2 by RT PCR NEGATIVE NEGATIVE Final    Comment: (NOTE) SARS-CoV-2 target nucleic acids are NOT DETECTED.  The SARS-CoV-2 RNA is generally detectable in upper respiratory specimens during the acute phase of infection. The lowest concentration of  SARS-CoV-2 viral copies this assay can detect is 138 copies/mL. A negative result does not preclude SARS-Cov-2 infection and should not be used as the sole basis for treatment or other patient management decisions. A negative result may occur with  improper specimen collection/handling, submission of specimen other than nasopharyngeal swab, presence of viral mutation(s) within  the areas targeted by this assay, and inadequate number of viral copies(<138 copies/mL). A negative result must be combined with clinical observations, patient history, and epidemiological information. The expected result is Negative.  Fact Sheet for Patients:  EntrepreneurPulse.com.au  Fact Sheet for Healthcare Providers:  IncredibleEmployment.be  This test is no t yet approved or cleared by the [place] FDA and  has been authorized for detection and/or diagnosis of SARS-CoV-2 by FDA under an Emergency Use Authorization (EUA). This EUA will remain  in effect (meaning this test can be used) for the duration of the COVID-19 declaration under Section 564(b)(1) of the Act, 21 U.S.C.section 360bbb-3(b)(1), unless the authorization is terminated  or revoked sooner.       Influenza A by PCR POSITIVE (A) NEGATIVE Final   Influenza B by PCR NEGATIVE NEGATIVE Final    Comment: (NOTE) The Xpert Xpress SARS-CoV-2/FLU/RSV plus assay is intended as an aid in the diagnosis of influenza from Nasopharyngeal swab specimens and should not be used as a sole basis for treatment. Nasal washings and aspirates are unacceptable for Xpert Xpress SARS-CoV-2/FLU/RSV testing.  Fact Sheet for Patients: EntrepreneurPulse.com.au  Fact Sheet for Healthcare Providers: IncredibleEmployment.be  This test is not yet approved or cleared by the [place] FDA and has been authorized for detection and/or diagnosis of SARS-CoV-2 by FDA under an Emergency Use  Authorization (EUA). This EUA will remain in effect (meaning this test can be used) for the duration of the COVID-19 declaration under Section 564(b)(1) of the Act, 21 U.S.C. section 360bbb-3(b)(1), unless the authorization is terminated or revoked.  Performed at [facility], [address], [place], [place] [address]      Time coordinating discharge: Over 30 minutes  SIGNED:   Nolberto Hanlon, MD  [facility] 01/06/2021, 5:10 PM Pager   If 7PM-7AM, please contact night-coverage [email or web address] Password [email or web address]

## 2021-01-06 NOTE — TOC Transition Note (Signed)
Transition of Care [facility]) - CM/SW Discharge Note   Patient Details  Name: Steven Buckley MRN: [id number] Date of Birth: Oct 07, 1940  [facility] CM/SW Contact:  Kerin Salen, RN Phone Number: 01/06/2021, 5:25 PM   Clinical Narrative:  Spoke with patient's wife, Lelon Frohlich who says patient will discharge home today and next week will admit to [facility] for [facility]. Wife indicated that she was pleased with the care received by everyone here at [facility]. Will continue to have [facility] [employer] private duty care and voices no other needs at this time. TOC to sign off.     Final next level of care: Home/Self Care Barriers to Discharge: Barriers Resolved   Patient Goals and CMS Choice Patient states their goals for this hospitalization and ongoing recovery are:: Will discharge home and then to [facility].gov Compare Post Acute Care list provided to:: Patient Choice offered to / list presented to : Patient, Spouse  Discharge Placement                  Name of family member notified: Spouse Ander Gaster Patient and family notified of of transfer: 01/06/21  Discharge Plan and Services   Discharge Planning Services: CM Consult Post Acute Care Choice: [place]          DME Arranged: N/A DME Agency: NA       HH Arranged: NA          Social Determinants of Health (SDOH) Interventions     Readmission Risk Interventions No flowsheet data found.

## 2021-01-06 NOTE — NC FL2 (Signed)
[place] LEVEL OF CARE SCREENING TOOL     IDENTIFICATION  Patient Name: Steven Buckley Birthdate: 1940/08/12 Sex: male Admission Date (Current Location): 12/28/2020  [facility] and [place] Number:  Engineering geologist and Address:  [facility], [address] [place], [place] [address]      Provider Number: [id number]  Attending Physician Name and Address:  Nolberto Hanlon, MD  Relative Name and Phone Number:  Karandeep, Resende (Spouse)   [phone]    Current Level of Care: Hospital Recommended Level of Care: Memory Care Prior Approval Number:    Date Approved/Denied:   PASRR Number:    Discharge Plan: Other (Comment) (Memory Care)    Current Diagnoses: Patient Active Problem List   Diagnosis Date Noted   Acute metabolic encephalopathy 25/42/7062   Altered mental status 12/28/2020   Influenza A 12/28/2020   Dementia 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 ([place]) 12/28/2020   Neurogenic orthostatic hypotension ([place]) 12/28/2020    Orientation RESPIRATION BLADDER Height & Weight     Self  Normal Continent Weight: 80 kg Height:  5\' 11"  (180.3 cm)  BEHAVIORAL SYMPTOMS/MOOD NEUROLOGICAL BOWEL NUTRITION STATUS  Other (Comment) ([place])   Continent Diet  AMBULATORY STATUS COMMUNICATION OF NEEDS Skin   Limited Assist Verbally Bruising                       Personal Care Assistance Level of Assistance  Bathing, Feeding, Dressing Bathing Assistance: Limited assistance Feeding assistance: Limited assistance Dressing Assistance: Limited assistance     Functional Limitations Info  Sight, Hearing, Speech Sight Info: Adequate Hearing Info: Adequate Speech Info: Adequate    SPECIAL CARE FACTORS FREQUENCY  PT (By licensed PT), OT (By licensed OT)     PT Frequency: 5 times per week OT Frequency: 5 times per week            Contractures Contractures Info: Not present    Additional Factors Info   Code Status, Allergies Code Status Info: DNR Allergies Info: Niacin           Current Medications (01/06/2021):  This is the current hospital active medication list Current Facility-Administered Medications  Medication Dose Route Frequency Provider Last Rate Last Admin   acetaminophen (TYLENOL) tablet 650 mg  650 mg Oral Q6H PRN Nicole Kindred A, DO   650 mg at 12/30/20 1005   artificial tears (LACRILUBE) ophthalmic ointment   Both Eyes TID PRN Nicole Kindred A, DO       carbidopa-levodopa (SINEMET CR) 50-200 MG per tablet controlled release 1 tablet  1 tablet Oral QHS Nicole Kindred A, DO   1 tablet at 01/05/21 1948   carbidopa-levodopa (SINEMET IR) 25-100 MG per tablet immediate release 2 tablet  2 tablet Oral QID Nicole Kindred A, DO   2 tablet at 01/06/21 1617   cholecalciferol (VITAMIN D) tablet 1,000 Units  1,000 Units Oral Daily Nicole Kindred A, DO   1,000 Units at 01/06/21 0750   clonazePAM (KLONOPIN) tablet 0.5 mg  0.5 mg Oral BID PRN Nicole Kindred A, DO   0.5 mg at 01/06/21 1617   diphenhydrAMINE (BENADRYL) capsule 25 mg  25 mg Oral Q6H PRN Ralene Muskrat B, MD   25 mg at 12/30/20 1230   enoxaparin (LOVENOX) injection 40 mg  40 mg Subcutaneous Q24H Ralene Muskrat B, MD   40 mg at 01/05/21 1947   fludrocortisone (FLORINEF) tablet 0.1 mg  0.1 mg Oral BID Ar[place]us Ped,  Kelly A, DO   0.1 mg at 01/06/21 0750   guaiFENesin-dextromethorphan (ROBITUSSIN DM) 100-10 MG/5ML syrup 5 mL  5 mL Oral Q4H PRN Nicole Kindred A, DO   5 mL at 12/30/20 1932   ibuprofen (ADVIL) tablet 400 mg  400 mg Oral Q6H PRN Ralene Muskrat B, MD   400 mg at 01/06/21 0750   melatonin tablet 10 mg  10 mg Oral QHS Nicole Kindred A, DO   10 mg at 01/05/21 1948   menthol-cetylpyridinium (CEPACOL) lozenge 3 mg  1 lozenge Oral PRN Mansy, Jan A, MD       midodrine (PROAMATINE) tablet 5 mg  5 mg Oral TID WC Priscella Mann, Sudheer B, MD   5 mg at 01/05/21 0818   multivitamin-lutein (OCUVITE-LUTEIN) capsule 1 capsule   1 capsule Oral BID Nicole Kindred A, DO   1 capsule at 01/06/21 0750   polyvinyl alcohol (LIQUIFILM TEARS) 1.4 % ophthalmic solution 2 drop  2 drop Both Eyes PRN Ralene Muskrat B, MD       psyllium (HYDROCIL/METAMUCIL) 1 packet  1 packet Oral Daily Nicole Kindred A, DO   1 packet at 01/06/21 0750   tamsulosin (FLOMAX) capsule 0.4 mg  0.4 mg Oral Daily PRN Nicole Kindred A, DO       timolol (TIMOPTIC) 0.5 % ophthalmic solution 1 drop  1 drop Both Eyes Daily Nicole Kindred A, DO   1 drop at 01/06/21 0750   traZODone (DESYREL) tablet 50 mg  50 mg Oral QHS Ralene Muskrat B, MD   50 mg at 01/05/21 1947     Discharge Medications: Please see discharge summary for a list of discharge medications.  Relevant Imaging Results:  Relevant Lab Results:   Additional Information SS# [id number]  Kerin Salen, RN

## 2021-01-06 NOTE — Progress Notes (Signed)
Patient discharged home in stable condition with wife, daughter and personal care aide.

## 2021-01-06 NOTE — Progress Notes (Addendum)
Occupational Therapy Treatment Patient Details Name: Steven Buckley MRN: [id number] DOB: [birth year] Today's Date: 01/06/2021   History of present illness 80 y.o. male with medical history significant of Parkinson's disease with dementia, orthostatic hypotension, urinary urgency due to neurogenic bladder, laryngopharyngeal reflux (LPR), lumbosacral spondylosis with radiculopathy presented to the ED via EMS due to altered mental status and a fall.  Wife at bedside during encounter reported patient tested positive for COVID-19 on Monday (2 days ago), received an infusion on Tuesday (unable to find record of this on chart review) and prescribed Paxlovid, Z-Pak by PCP. Pt also positive for flu.   OT comments  Steven Buckley seen for OT treatment on this date. Upon arrival to room pt awake/alert. Pt standing  in room with family and caregiver present. Pt agreeable to tx. Pt SBA for functional mobility, navigating room environment, practicing visual scanning. Pt SBA + VCs for grooming, standing sinkside ~ 5 min, toothbrushing and hairburshing. Pt w/ good standing balance and sequencing for ADL tasks but needing Vcs for staying on task and limiting distractions.  Pt making good progress toward goals. Pt continues to benefit from skilled OT services to maximize return to PLOF and minimize risk of future falls, injury, caregiver burden, and readmission. Will continue to follow POC. Discharge recommendation remains appropriate.     Recommendations for follow up therapy are one component of a multi-disciplinary discharge planning process, led by the attending physician.  Recommendations may be updated based on patient status, additional functional criteria and insurance authorization.    Follow Up Recommendations  Home health OT    Assistance Recommended at Discharge Intermittent Supervision/Assistance  Equipment Recommendations  Other (comment) (defer to next venue of care)    Recommendations for Other  Services      Precautions / Restrictions Precautions Precautions: Fall Restrictions Weight Bearing Restrictions: No       Mobility Bed Mobility Overal bed mobility: Needs Assistance Bed Mobility: Sit to Supine       Sit to supine: Supervision   General bed mobility comments: Pt received standing in room with family    Transfers Overall transfer level: Needs assistance Equipment used: None Transfers: Sit to/from Stand Sit to Stand: Supervision                 Balance Overall balance assessment: Needs assistance Sitting-balance support: Feet supported;No upper extremity supported Sitting balance-Leahy Scale: Good     Standing balance support: During functional activity;No upper extremity supported Standing balance-Leahy Scale: Good                             ADL either performed or assessed with clinical judgement   ADL Overall ADL's : Needs assistance/impaired                                       General ADL Comments: Pt SBA for functional mobility, navigating room environment, viusal scanning. Pt SBA + VCs for grooming, standing sinkside ~ 5 min, toothbrushing and hairburshing.    Extremity/Trunk Assessment              Vision       Perception     Praxis      Cognition Arousal/Alertness: Awake/alert Behavior During Therapy: WFL for tasks assessed/performed Overall Cognitive Status: History of cognitive impairments - at baseline  Exercises Exercises: Other exercises Other Exercises Other Exercises: Pt educ re: OT role, falls prevention, visual scanning Other Exercises: toothbrushing, environmental navigation task, hairburshing, visual scanning task   Shoulder Instructions       General Comments      Pertinent Vitals/ Pain       Pain Assessment: No/denies pain  Home Living                                          Prior  Functioning/Environment              Frequency  Min 2X/week        Progress Toward Goals  OT Goals(current goals can now be found in the care plan section)  Progress towards OT goals: Progressing toward goals  Acute Rehab OT Goals Patient Stated Goal: to get better OT Goal Formulation: With patient Time For Goal Achievement: 01/12/21 Potential to Achieve Goals: Good ADL Goals Pt Will Perform Grooming: with supervision;standing Pt Will Perform Lower Body Dressing: with supervision;sit to/from stand Pt Will Transfer to Toilet: with supervision;ambulating Pt Will Perform Toileting - Clothing Manipulation and hygiene: with supervision;sit to/from stand  Plan Frequency remains appropriate;Discharge plan needs to be updated    Co-evaluation                 AM-PAC OT "6 Clicks" Daily Activity     Outcome Measure   Help from another person eating meals?: None Help from another person taking care of personal grooming?: A Little Help from another person toileting, which includes using toliet, bedpan, or urinal?: A Little Help from another person bathing (including washing, rinsing, drying)?: A Little Help from another person to put on and taking off regular upper body clothing?: A Little Help from another person to put on and taking off regular lower body clothing?: A Little 6 Click Score: 19    End of Session Equipment Utilized During Treatment: Gait belt  OT Visit Diagnosis: Unsteadiness on feet (R26.81);Repeated falls (R29.6);Muscle weakness (generalized) (M62.81);History of falling (Z91.81)   Activity Tolerance Patient tolerated treatment well   Patient Left in bed;with call bell/phone within reach;with nursing/sitter in room   Nurse Communication          Time: 0242-0256 OT Time Calculation (min): 14 min  Charges: OT General Charges $OT Visit: 1 Visit OT Treatments $Self Care/Home Management : 8-22 mins  Nino Glow, Markus Daft 01/06/2021,  3:27 PM

## 2021-01-09 ENCOUNTER — Emergency Department
Admission: EM | Admit: 2021-01-09 | Discharge: 2021-01-09 | Disposition: A | Discharge status: Home / Self Care | Payer: PPO | Attending: Emergency Medicine | Admitting: Emergency Medicine

## 2021-01-09 ENCOUNTER — Encounter: Payer: Self-pay | Admitting: Emergency Medicine

## 2021-01-09 ENCOUNTER — Emergency Department: Payer: PPO

## 2021-01-09 ENCOUNTER — Other Ambulatory Visit: Payer: Self-pay

## 2021-01-09 DIAGNOSIS — Z79899 Other long term (current) drug therapy: Secondary | ICD-10-CM | POA: Insufficient documentation

## 2021-01-09 DIAGNOSIS — K5900 Constipation, unspecified: Secondary | ICD-10-CM | POA: Diagnosis not present

## 2021-01-09 DIAGNOSIS — D72829 Elevated white blood cell count, unspecified: Secondary | ICD-10-CM | POA: Insufficient documentation

## 2021-01-09 DIAGNOSIS — Z8546 Personal history of malignant neoplasm of prostate: Secondary | ICD-10-CM | POA: Insufficient documentation

## 2021-01-09 DIAGNOSIS — R39198 Other difficulties with micturition: Secondary | ICD-10-CM | POA: Diagnosis not present

## 2021-01-09 DIAGNOSIS — Z743 Need for continuous supervision: Secondary | ICD-10-CM | POA: Diagnosis not present

## 2021-01-09 DIAGNOSIS — R6889 Other general symptoms and signs: Secondary | ICD-10-CM | POA: Diagnosis not present

## 2021-01-09 DIAGNOSIS — Z87891 Personal history of nicotine dependence: Secondary | ICD-10-CM | POA: Diagnosis not present

## 2021-01-09 DIAGNOSIS — G20 Parkinson's disease: Secondary | ICD-10-CM | POA: Diagnosis not present

## 2021-01-09 DIAGNOSIS — K219 Gastro-esophageal reflux disease without esophagitis: Secondary | ICD-10-CM | POA: Insufficient documentation

## 2021-01-09 DIAGNOSIS — R5381 Other malaise: Secondary | ICD-10-CM | POA: Diagnosis not present

## 2021-01-09 DIAGNOSIS — R748 Abnormal levels of other serum enzymes: Secondary | ICD-10-CM | POA: Insufficient documentation

## 2021-01-09 DIAGNOSIS — F0280 Dementia in other diseases classified elsewhere without behavioral disturbance: Secondary | ICD-10-CM | POA: Diagnosis not present

## 2021-01-09 LAB — COMPREHENSIVE METABOLIC PANEL
ALT: 9 U/L (ref 0–44)
AST: 21 U/L (ref 15–41)
Albumin: 3.6 g/dL (ref 3.5–5.0)
Alkaline Phosphatase: 132 U/L — ABNORMAL HIGH (ref 38–126)
Anion gap: 6 (ref 5–15)
BUN: 17 mg/dL (ref 8–23)
CO2: 29 mmol/L (ref 22–32)
Calcium: 8.8 mg/dL — ABNORMAL LOW (ref 8.9–10.3)
Chloride: 107 mmol/L (ref 98–111)
Creatinine, Ser: 0.69 mg/dL (ref 0.61–1.24)
GFR, Estimated: >60 mL/min (ref >60)
Glucose, Bld: 107 mg/dL — ABNORMAL HIGH (ref 70–99)
Potassium: 3.5 mmol/L (ref 3.5–5.1)
Sodium: 142 mmol/L (ref 135–145)
Total Bilirubin: 1 mg/dL (ref 0.3–1.2)
Total Protein: 6.6 g/dL (ref 6.5–8.1)

## 2021-01-09 LAB — CBC
HCT: 44.2 % (ref 39.0–52.0)
Hemoglobin: 14.2 g/dL (ref 13.0–17.0)
MCH: 30.6 pg (ref 26.0–34.0)
MCHC: 32.1 g/dL (ref 30.0–36.0)
MCV: 95.3 fL (ref 80.0–100.0)
Platelets: 228 10*3/uL (ref 150–400)
RBC: 4.64 MIL/uL (ref 4.22–5.81)
RDW: 13.3 % (ref 11.5–15.5)
WBC: 10.6 10*3/uL — ABNORMAL HIGH (ref 4.0–10.5)
nRBC: 0 % (ref 0.0–0.2)

## 2021-01-09 LAB — LIPASE, BLOOD: Lipase: 26 U/L (ref 11–51)

## 2021-01-09 MED ORDER — MAGNESIUM CITRATE PO SOLN: 0.5000 | Freq: Every day | ORAL | 0 refills | Status: AC | PRN

## 2021-01-09 MED ORDER — POLYETHYLENE GLYCOL 3350 17 G PO PACK: 17.0000 g | PACK | Freq: Every day | ORAL | 0 refills | Status: AC

## 2021-01-09 NOTE — ED Provider Notes (Signed)
Emergency Medicine Provider Triage Evaluation Note  Steven Buckley, a 80 y.o. male  was evaluated in triage.  Pt complains of constipation.  Patient was discharged Friday night from a hospital admission, and reports he has had inability to pass stool for the last 3 to 5 days.  Patient's home health nurse attempted enema at home without relief.  No nausea, vomiting, or fevers reported.  Review of Systems  Positive: Constipation, fecal incontinece Negative: NV  Physical Exam  BP (!) 127/94   Pulse 92   Temp 98.1 F (36.7 C) (Oral)   Resp 18   Ht 5\' 11"  (1.803 m)   Wt 80 kg   SpO2 98%   BMI 24.60 kg/m  Gen:   Awake, no distress  NAD Resp:  Normal effort CTA MSK:   Moves extremities without difficulty  Other:  ABD:  soft, nontender  Medical Decision Making  Medically screening exam initiated at 1:52 PM.  Appropriate orders placed.  Steven Buckley was informed that the remainder of the evaluation will be completed by another provider, this initial triage assessment does not replace that evaluation, and the importance of remaining in the ED until their evaluation is complete.  Geriatric patient with ED evaluation of constipation and possible fecal impaction.   Melvenia Needles, PA-C 01/09/21 1354    Nena Polio, MD 01/09/21 [phone]

## 2021-01-09 NOTE — ED Triage Notes (Signed)
First Nurse Note:  Arrives from home via [facility].  EMS called for c/o constipation x 5 days-- 3 enema's done.  VS wnl

## 2021-01-09 NOTE — Discharge Instructions (Addendum)
Take the magnesium citrate as needed in the next 1 to 2 days, and continue the MiraLAX over the next 1 to 2 weeks.  Return to the ER for new, worsening, or persistent severe constipation, abdominal or rectal pain, blood in the stool, vomiting, fever, weakness, or any other new or worsening symptoms that concern you.

## 2021-01-09 NOTE — ED Provider Notes (Signed)
[facility] Emergency Department Provider Note ____________________________________________   Event Date/Time   First MD Initiated Contact with Patient 01/09/21 1711     (approximate)  I have reviewed the triage vital signs and the nursing notes.   HISTORY  Chief Complaint Constipation  Level 5 caveat: History of present illness limited due to dementia  HPI Steven Buckley is a 80 y.o. male with PMH as noted below who presents with constipation over the last 3 days ever since he was discharged from the hospital.  He is also had some difficulty urinating over the last day.  The wife reports that the patient expressed pain this morning and was shaking the bed.  She tried to give him Colace and several other stool softeners without relief.  He is not on any medications for chronic constipation.  He has had no associated vomiting or fever.  The wife states that the patient had a large bowel movement and urinated while in the waiting room and his symptoms are now resolved.  They would like to go home.  Past Medical History:  Diagnosis Date   Anxiety    Arthritis    osteoarthritis   BPH (benign prostatic hyperplasia)    Cancer ([place]) 2007   prostate, treated with seeds. skin cancer also   Dementia ([place])    Dizziness 04/2017   looses balance frequently but no falls   GERD (gastroesophageal reflux disease)    Glaucoma    bilateral   History of kidney stones    Hypertriglyceridemia    Macular degeneration    Neurogenic orthostatic hypotension ([facility])    Orthostatic hypotension    Parkinson's disease ([place])     Patient Active Problem List   Diagnosis Date Noted   Acute metabolic encephalopathy 44/81/8563   Altered mental status 12/28/2020   Influenza A 12/28/2020   Dementia 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 ([place]) 12/28/2020   Neurogenic orthostatic hypotension ([place]) 12/28/2020    Past Surgical History:  Procedure Laterality  Date   COLONOSCOPY WITH PROPOFOL N/A 07/14/2018   Procedure: COLONOSCOPY WITH PROPOFOL;  Surgeon: Manya Silvas, MD;  [facility] END[facility];  Service: Endoscopy;  Laterality: N/A;   EYE SURGERY Bilateral    cataract extractions   HAND SURGERY Right    crushed hand in hay baler   HERNIA REPAIR Left 1949   inguinal repair   INGUINAL HERNIA REPAIR Right 05/22/2017   Procedure: HERNIA REPAIR INGUINAL ADULT;  Surgeon: Herbert Pun, MD;  Location: [facility] ORS;  Service: General;  Laterality: Right;   SHOULDER ARTHROSCOPY Right 1999   rotator cuff repair   skin cancer removal  2018   squamos cell on nose, basal cell on cheek, back   TONSILLECTOMY      Prior to Admission medications   Medication Sig Start Date End Date Taking? Authorizing Provider  magnesium citrate SOLN Take 148 mLs (0.5 Bottles total) by mouth daily as needed for up to 2 days (constipation). 01/09/21 01/11/21 Yes Arta Silence, MD  polyethylene glycol (MIRALAX) 17 g packet Take 17 g by mouth daily. 01/09/21  Yes Arta Silence, MD  acetaminophen (TYLENOL) 500 MG tablet Take 500 mg by mouth at bedtime.    [provider]  brimonidine (ALPHAGAN) 0.2 % ophthalmic solution Place 1 drop into both eyes 2 (two) times daily.    [provider]  carbidopa-levodopa (SINEMET CR) 50-200 MG tablet Take 1 tablet by mouth at bedtime.    [provider]  carbidopa-levodopa (SINEMET IR) 25-100 MG tablet Take 2 tablets by mouth 4 (four) times daily.    [provider]  cholecalciferol (VITAMIN D) 25 MCG (1000 UNIT) tablet Take 1 tablet (1,000 Units total) by mouth daily. 01/07/21 02/06/21  Nolberto Hanlon, MD  clonazePAM (KLONOPIN) 0.5 MG tablet Take 0.5 mg by mouth at bedtime as needed for anxiety (sleep). (Do not take with trazodone)    [provider]  fludrocortisone (FLORINEF) 0.1 MG tablet Take 0.1 mg by mouth daily.    [provider]  latanoprost (XALATAN) 0.005 %  ophthalmic solution Place 1 drop into both eyes at bedtime.    [provider]  melatonin 5 MG TABS Take 5 mg by mouth at bedtime.    [provider]  methimazole (TAPAZOLE) 5 MG tablet Take 2.5 mg by mouth every Monday, Wednesday, and Friday.    [provider]  midodrine (PROAMATINE) 5 MG tablet Take 1 tablet (5 mg total) by mouth 3 (three) times daily with meals. 01/07/21 02/06/21  Nolberto Hanlon, MD  multivitamin-lutein (OCUVITE-LUTEIN) CAPS capsule Take 1 capsule by mouth daily. 01/06/21 02/05/21  Nolberto Hanlon, MD  omeprazole (PRILOSEC) 20 MG capsule Take 20 mg by mouth at bedtime.    [provider]  psyllium (HYDROCIL/METAMUCIL) 95 % PACK Take 1 packet by mouth daily. 01/07/21 02/06/21  Nolberto Hanlon, MD  rOPINIRole (REQUIP) 0.5 MG tablet Take 0.5 mg by mouth 3 (three) times daily.    [provider]  silodosin (RAPAFLO) 8 MG CAPS capsule Take 8 mg by mouth 3 (three) times a week. (Alternate with Flomax)    [provider]  tamsulosin (FLOMAX) 0.4 MG CAPS capsule Take 0.4 mg by mouth 3 (three) times a week. (Alternate with Rapaflo)    [provider]  traZODone (DESYREL) 50 MG tablet Take 50 mg by mouth at bedtime as needed for sleep. (Do not take with clonazepam)    [provider]    Allergies Niacin  No family history on file.  Social History Social History   Tobacco Use   Smoking status: Former    Packs/day: 1.00    Types: Cigarettes    Quit date: 01/30/1967    Years since quitting: 53.9   Smokeless tobacco: Former   Tobacco comments:    tried smokeless tobacco but did not like it  Vaping Use   Vaping Use: Never used  Substance Use Topics   Alcohol use: Yes    Alcohol/week: 1.0 standard drink    Types: 1 Glasses of wine per week    Comment: once a week, maybe   Drug use: Never    Review of Systems Level 5 caveat: Unable to obtain review of systems due to  dementia    ____________________________________________   PHYSICAL EXAM:  VITAL SIGNS: ED Triage Vitals  Enc Vitals Group     BP 01/09/21 1340 (!) 127/94     Pulse Rate 01/09/21 1340 92     Resp 01/09/21 1340 18     Temp 01/09/21 1340 98.1 F (36.7 C)     Temp Source 01/09/21 1340 Oral     SpO2 01/09/21 1340 98 %     Weight 01/09/21 1317 176 lb 5.9 oz (80 kg)     Height 01/09/21 1317 5' 11"  (1.803 m)     Head Circumference --      Peak Flow --      Pain Score 01/09/21 1317 4     Pain Loc --  Pain Edu? --      Excl. in [place]? --     Constitutional: Alert, slightly confused (at baseline per wife).  Comfortable appearing, no acute distress. Eyes: Conjunctivae are normal.  Head: Atraumatic. Nose: No congestion/rhinnorhea. Mouth/Throat: Mucous membranes are moist.   Neck: Normal range of motion.  Cardiovascular:  Good peripheral circulation. Respiratory: Normal respiratory effort.   Gastrointestinal: Soft and nontender. No distention.  Genitourinary: No flank tenderness. Musculoskeletal: Extremities warm and well perfused.  Neurologic:  Normal speech and language. No gross focal neurologic deficits are appreciated.  Skin:  Skin is warm and dry. No rash noted. Psychiatric: Mood and affect are normal. Speech and behavior are normal.  ____________________________________________   LABS (all labs ordered are listed, but only abnormal results are displayed)  Labs Reviewed  COMPREHENSIVE METABOLIC PANEL - Abnormal; Notable for the following components:      Result Value   Glucose, Bld 107 (*)    Calcium 8.8 (*)    Alkaline Phosphatase 132 (*)    All other components within normal limits  CBC - Abnormal; Notable for the following components:   WBC 10.6 (*)    All other components within normal limits  LIPASE, BLOOD  URINALYSIS, ROUTINE W REFLEX MICROSCOPIC    ____________________________________________  EKG   ____________________________________________  RADIOLOGY  XR abdomen: IMPRESSION:  1. Moderate fecal retention.  No bowel obstruction.    ____________________________________________   PROCEDURES  Procedure(s) performed: No  Procedures  Critical Care performed: No ____________________________________________   INITIAL IMPRESSION / ASSESSMENT AND PLAN / ED COURSE  Pertinent labs & imaging results that were available during my care of the patient were reviewed by me and considered in my medical decision making (see chart for details).   80 year old male with PMH as noted above presents for constipation and some difficulty urinating over the last several days since he was discharged from the hospital.  However, he has now had a bowel movement and urinated while in the waiting room and is feeling much better.   I reviewed the past medical records in Epic and confirmed that the patient was just admitted for flu and altered mental status, and discharged on 12/9.  Discharge summary notes chronic constipation with a plan for "continue bowel regimen" but review of the patient's medication reconciliation upon discharge demonstrates that he is not on any medications for chronic constipation.  I confirmed this with the wife.  On exam the patient is well-appearing and his vital signs are normal except for borderline tachycardia at triage likely when he was in acute pain.  The abdomen is soft and nontender.  Physical exam is otherwise unremarkable.  Lab work-up obtained from triage is within normal limits except for borderline leukocytosis and borderline alk phos.  Abdominal x-ray shows some fecal retention but no evidence of obstruction or other concerning acute findings.  At this time, given that the patient symptoms have resolved, he and the wife would like to go home.  Although he has not given a urine sample yet, he has no active  urinary symptoms, altered mental status, fever, or other clinical evidence of UTI.  The patient has a history of sundowning in the wife would like to go home soon as possible.  He is stable for discharge at this time.  I counseled her on the results of the work-up.  I will prescribe MiraLAX as well as magnesium citrate.  Return precautions given, and she expresses understanding.   ____________________________________________   FINAL CLINICAL IMPRESSION(S) /  ED DIAGNOSES  Final diagnoses:  Constipation, unspecified constipation type      NEW MEDICATIONS STARTED DURING THIS VISIT:  New Prescriptions   MAGNESIUM CITRATE SOLN    Take 148 mLs (0.5 Bottles total) by mouth daily as needed for up to 2 days (constipation).   POLYETHYLENE GLYCOL (MIRALAX) 17 G PACKET    Take 17 g by mouth daily.     Note:  This document was prepared using Dragon voice recognition software and may include unintentional dictation errors.    Arta Silence, MD 01/09/21 [phone]

## 2021-01-09 NOTE — ED Notes (Signed)
Sent rainbow to lab. 

## 2021-01-29 DEATH — deceased

## 2022-09-24 IMAGING — MR MR HEAD W/O CM
10 series · 48 of 48 positions shown · non-contrast
Comparison: CT head 12/18/2020

CLINICAL DATA: Acute neuro deficit.  Slurred speech

EXAM:
MRI HEAD WITHOUT CONTRAST
TECHNIQUE: Multiplanar, multiecho pulse sequences of the brain and surrounding
structures were obtained without intravenous contrast.

[Series 5: ax dwi_tracew · axial · 3.0mm · 0.71mm/px · z∈[-69,+95]mm · 7 of 56 slices shown]
[im 1/56]
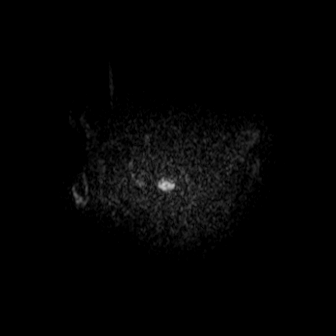
[im 10/56]
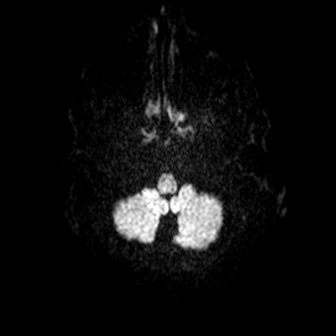
[im 19/56]
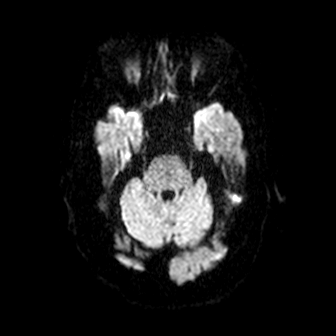
[im 28/56]
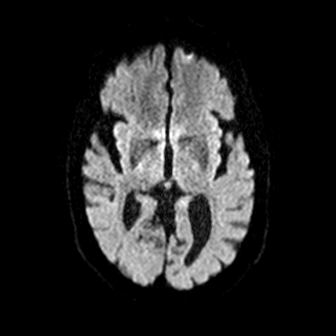
[im 37/56]
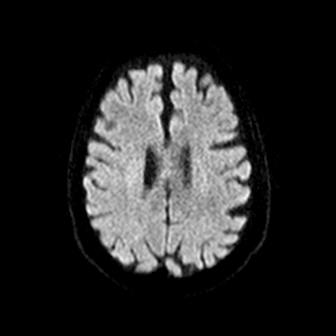
[im 46/56]
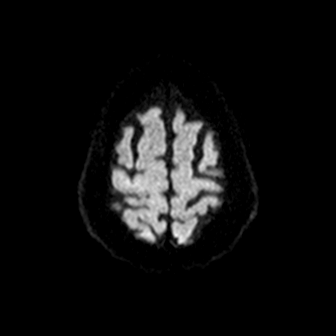
[im 56/56]
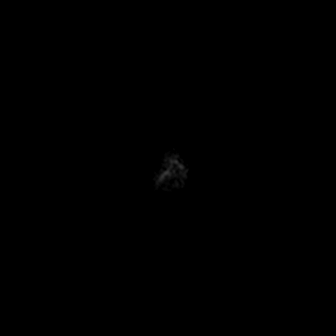

[Series 6: ax dwi_adc · axial · 3.0mm · 0.71mm/px · z∈[-69,+95]mm · 7 of 56 slices shown]
[im 1/56]
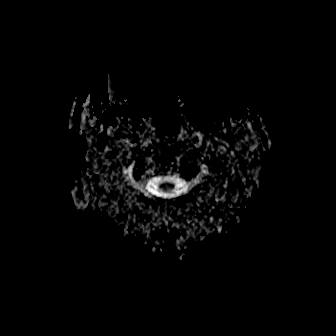
[im 10/56]
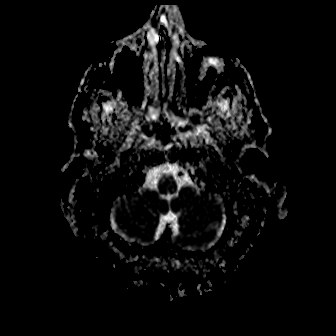
[im 19/56]
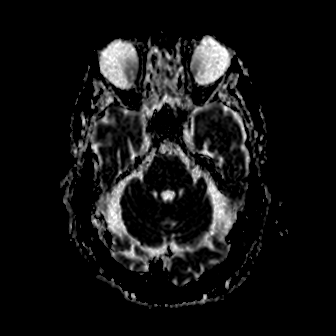
[im 28/56]
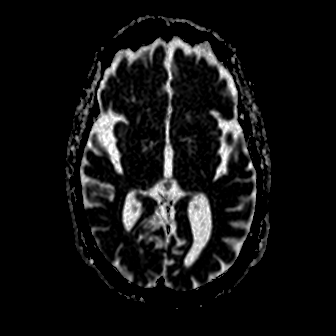
[im 37/56]
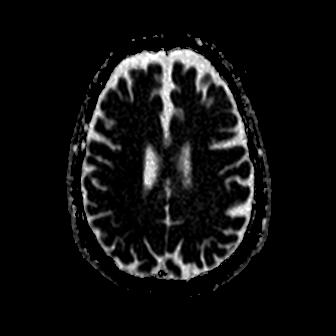
[im 46/56]
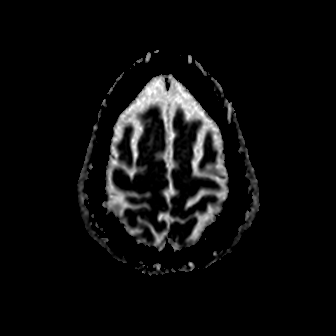
[im 56/56]
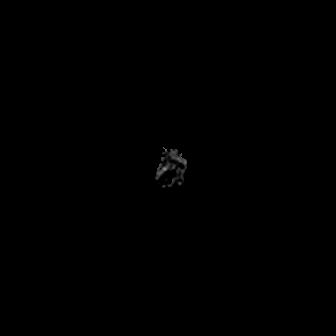

[Series 7: cor dwi_tracew · coronal · 5.0mm · 0.68mm/px · 4 of 42 slices shown]
[im 1/42]
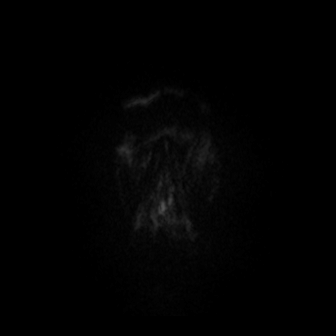
[im 14/42]
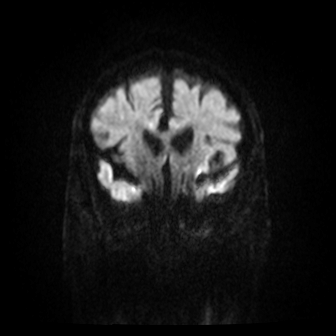
[im 28/42]
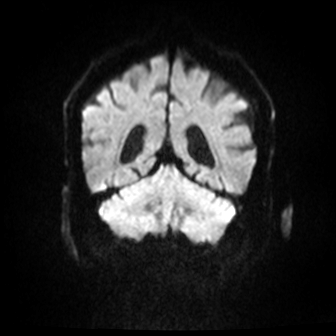
[im 42/42]
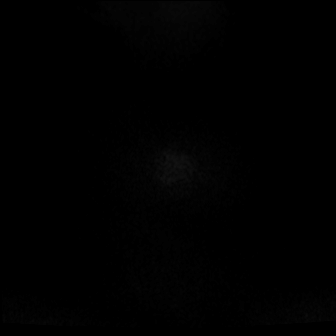

[Series 8: cor dwi_adc · coronal · 5.0mm · 0.68mm/px · 4 of 41 slices shown]
[im 1/41]
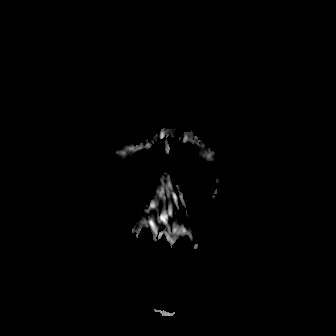
[im 14/41]
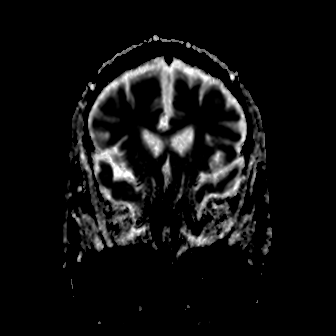
[im 27/41]
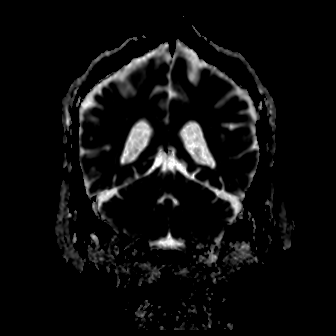
[im 41/41]
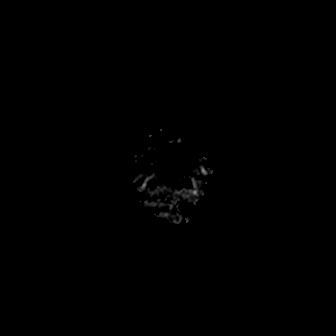

[Series 9: T1 · sagittal · 5.0mm · 0.47mm/px · 2 of 23 slices shown]
[im 1/23]
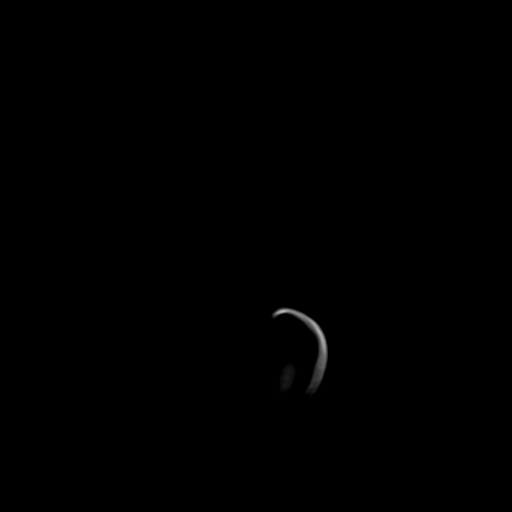
[im 23/23]
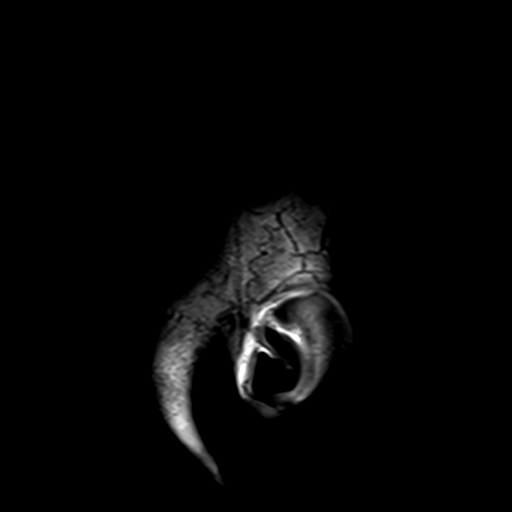

[Series 10: T2 · axial · 5.0mm · 0.86mm/px · z∈[-55,+88]mm · 3 of 25 slices shown]
[im 1/25]
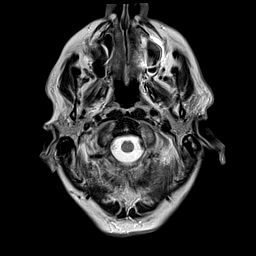
[im 13/25]
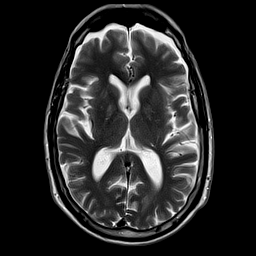
[im 25/25]
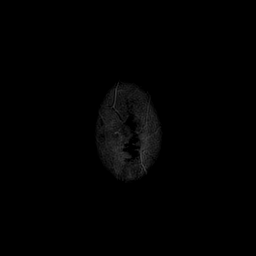

[Series 11: mag_images · axial · 3.0mm · 0.90mm/px · z∈[-60,+92]mm · 5 of 52 slices shown]
[im 1/52]
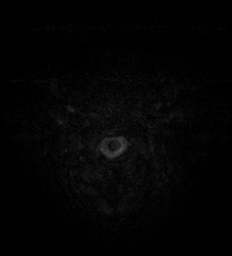
[im 13/52]
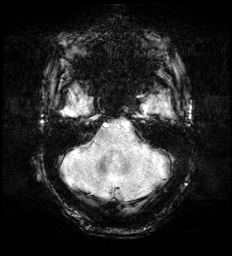
[im 26/52]
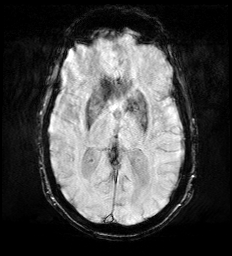
[im 39/52]
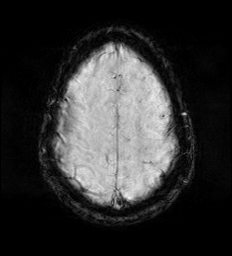
[im 52/52]
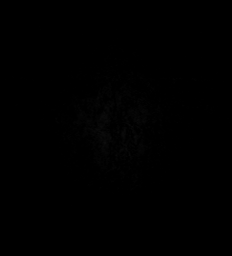

[Series 12: pha_images · axial · 3.0mm · 0.90mm/px · z∈[-60,+92]mm · 5 of 52 slices shown]
[im 1/52]
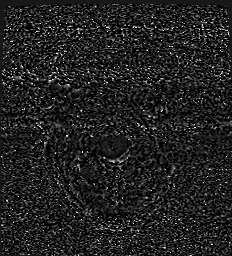
[im 13/52]
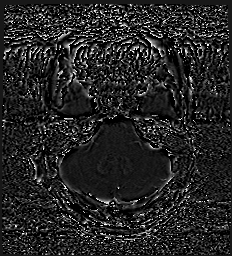
[im 26/52]
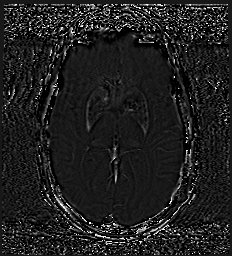
[im 39/52]
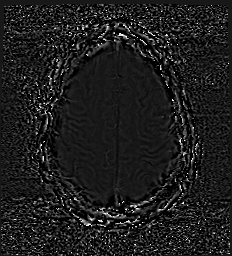
[im 52/52]
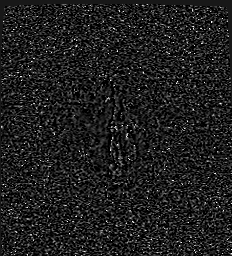

[Series 13: swi_images · axial · 3.0mm · 0.90mm/px · z∈[-60,+92]mm · 5 of 52 slices shown]
[im 1/52]
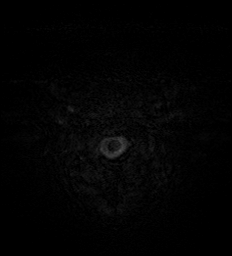
[im 13/52]
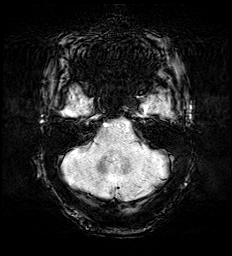
[im 26/52]
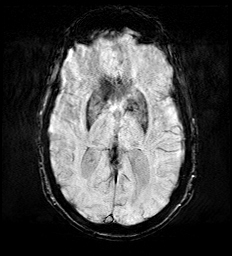
[im 39/52]
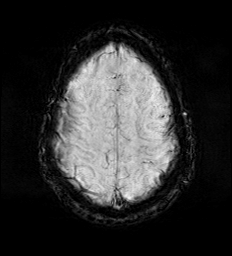
[im 52/52]
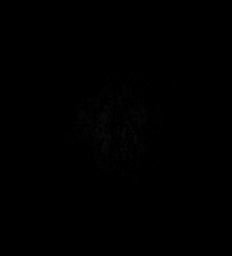

[Series 15: FLAIR · axial · 3.0mm · 0.69mm/px · z∈[-64,+97]mm · 6 of 55 slices shown]
[im 1/55]
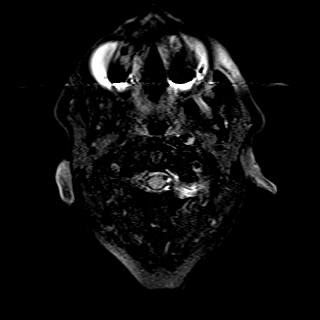
[im 11/55]
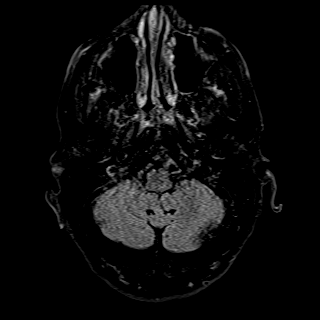
[im 22/55]
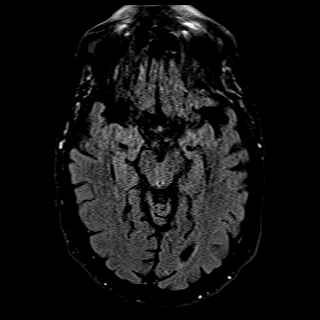
[im 33/55]
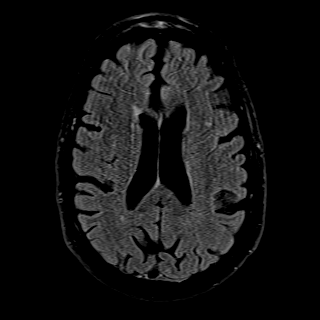
[im 44/55]
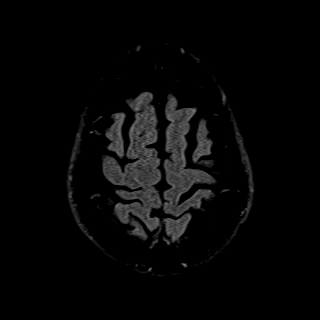
[im 55/55]
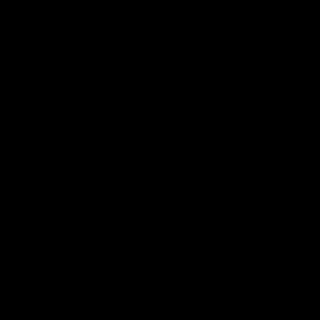

[48 of 48 positions shown; findings below may reference images not displayed]

FINDINGS: Brain: Mild atrophy without hydrocephalus. Negative for hemorrhage
or mass. Minimal chronic white matter changes

Negative for acute infarct.

Vascular: Decreased flow void in the distal right vertebral artery
similar to the MRI of July 09, 2013. Otherwise normal arterial flow
voids at the skull base.

Skull and upper cervical spine: Negative

Sinuses/Orbits: Mild mucosal edema paranasal sinuses. Bilateral
cataract extraction

Other: None
IMPRESSION: No acute abnormality.

Decreased flow void distal right vertebral artery which is chronic
and may be due to occlusion or decreased flow.

## 2022-10-06 IMAGING — CR DG ABDOMEN 1V
1 series · 2 of 2 positions shown · non-contrast
Comparison: None.

CLINICAL DATA: Constipation for 5 days

EXAM:
ABDOMEN - 1 VIEW

[Series 1: dg abd 1 view · 0.14mm/px · 2 of 2 slices shown]
[im 1/2]
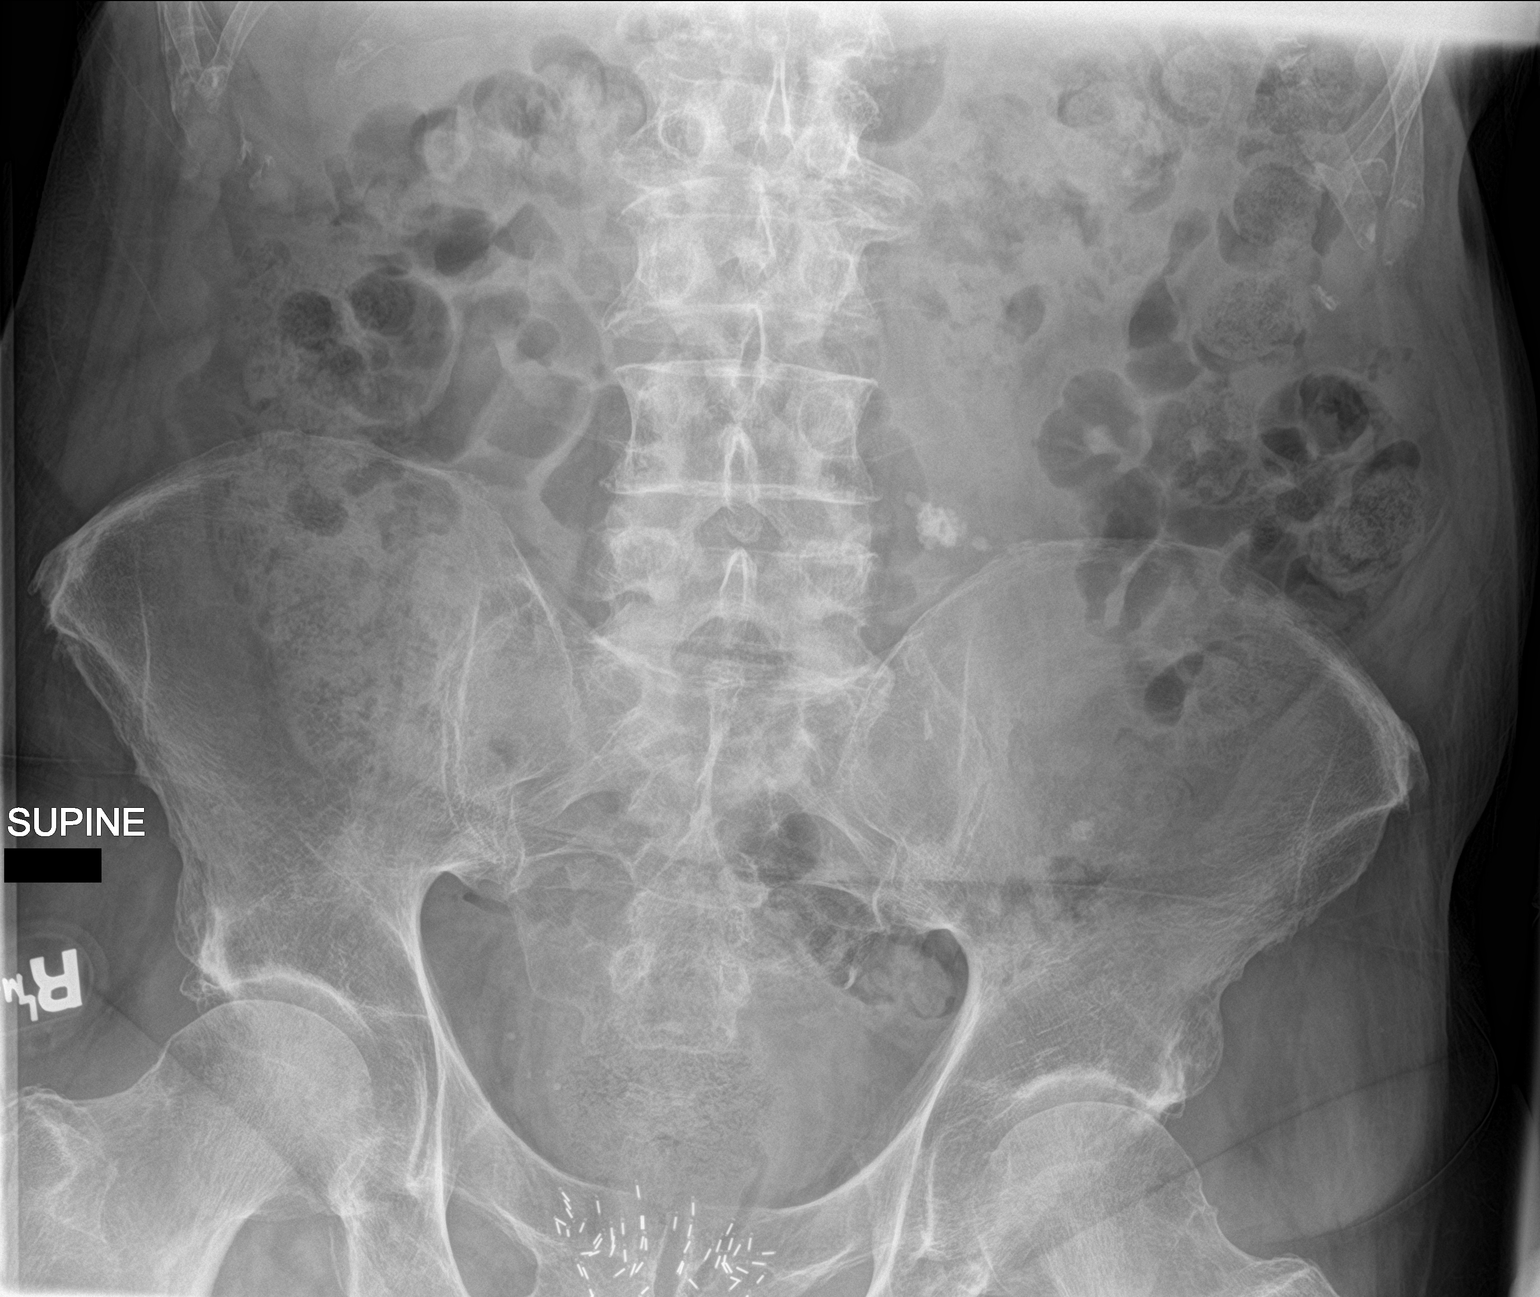
[im 2/2]
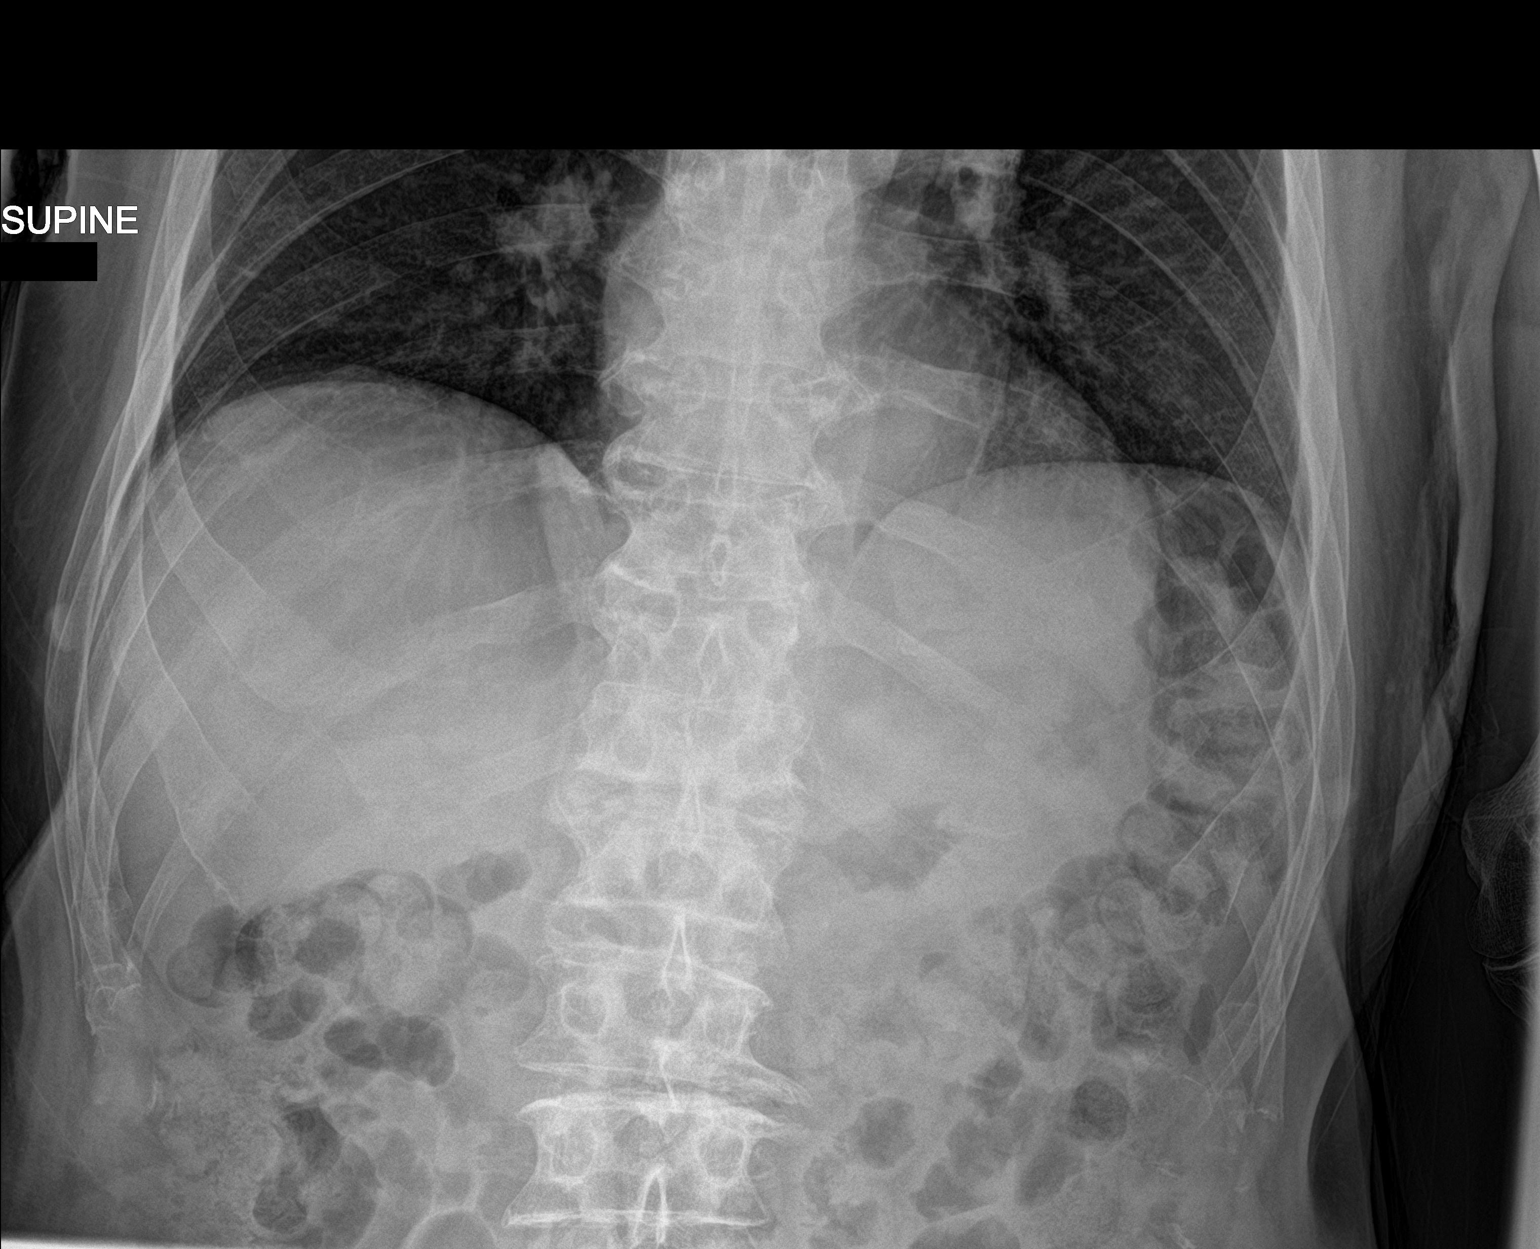

[2 of 2 positions shown; findings below may reference images not displayed]

FINDINGS: 2 supine frontal views of the abdomen and pelvis are obtained. No
bowel obstruction or ileus. There is moderate stool throughout the
colon. Abdominal masses. 15 mm radiodensity within the left lower
quadrant is likely material within the fecal stream, as it was not
present on a recent lumbar spine exam. Radiotherapy seeds are seen
within the prostate bed. Stable thoracolumbar spondylosis. Lung
bases are clear.
IMPRESSION: 1. Moderate fecal retention.  No bowel obstruction.
